# Patient Record
Sex: Female | Born: 1935 | Race: White | Hispanic: No | Smoking: Never smoker
Health system: Southern US, Community
[De-identification: ages and names within clinical notes are randomized; demographics above are authoritative.]

## PROBLEM LIST (undated history)

## (undated) DIAGNOSIS — I10 Essential (primary) hypertension: Secondary | ICD-10-CM

## (undated) DIAGNOSIS — M899 Disorder of bone, unspecified: Secondary | ICD-10-CM

## (undated) DIAGNOSIS — R569 Unspecified convulsions: Secondary | ICD-10-CM

## (undated) DIAGNOSIS — N3946 Mixed incontinence: Secondary | ICD-10-CM

## (undated) DIAGNOSIS — M159 Polyosteoarthritis, unspecified: Secondary | ICD-10-CM

## (undated) DIAGNOSIS — H900 Conductive hearing loss, bilateral: Secondary | ICD-10-CM

## (undated) DIAGNOSIS — M949 Disorder of cartilage, unspecified: Secondary | ICD-10-CM

## (undated) DIAGNOSIS — E785 Hyperlipidemia, unspecified: Secondary | ICD-10-CM

## (undated) HISTORY — DX: Mixed incontinence: N39.46

## (undated) HISTORY — DX: Polyosteoarthritis, unspecified: M15.9

## (undated) HISTORY — PX: CATARACT EXTRACTION: SUR2

## (undated) HISTORY — DX: Hyperlipidemia, unspecified: E78.5

## (undated) HISTORY — DX: Unspecified convulsions: R56.9

## (undated) HISTORY — PX: FRACTURE SURGERY: SHX138

## (undated) HISTORY — DX: Conductive hearing loss, bilateral: H90.0

## (undated) HISTORY — DX: Essential (primary) hypertension: I10

## (undated) HISTORY — DX: Disorder of bone, unspecified: M89.9

## (undated) HISTORY — DX: Disorder of cartilage, unspecified: M94.9

---
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
## 1963-05-11 DIAGNOSIS — R569 Unspecified convulsions: Secondary | ICD-10-CM

## 1963-05-11 HISTORY — DX: Unspecified convulsions: R56.9

## 1989-05-10 HISTORY — PX: ABDOMINAL HYSTERECTOMY: SHX81

## 1997-08-22 ENCOUNTER — Other Ambulatory Visit: Admission: RE | Admit: 1997-08-22 | Discharge: 1997-08-22 | Payer: Self-pay | Admitting: Family Medicine

## 1997-09-19 ENCOUNTER — Other Ambulatory Visit: Admission: RE | Admit: 1997-09-19 | Discharge: 1997-09-19 | Payer: Self-pay | Admitting: Family Medicine

## 1997-10-14 ENCOUNTER — Other Ambulatory Visit: Admission: RE | Admit: 1997-10-14 | Discharge: 1997-10-14 | Payer: Self-pay | Admitting: Family Medicine

## 1998-03-06 ENCOUNTER — Ambulatory Visit (HOSPITAL_COMMUNITY): Admission: RE | Admit: 1998-03-06 | Discharge: 1998-03-06 | Payer: Self-pay | Admitting: Family Medicine

## 1998-04-08 ENCOUNTER — Encounter (HOSPITAL_COMMUNITY): Admission: RE | Admit: 1998-04-08 | Discharge: 1998-07-07 | Payer: Self-pay | Admitting: Family Medicine

## 1999-07-30 ENCOUNTER — Encounter: Payer: Self-pay | Admitting: Emergency Medicine

## 1999-07-30 ENCOUNTER — Emergency Department (HOSPITAL_COMMUNITY): Admission: EM | Admit: 1999-07-30 | Discharge: 1999-07-30 | Payer: Self-pay | Admitting: Emergency Medicine

## 1999-09-15 ENCOUNTER — Encounter: Payer: Self-pay | Admitting: Family Medicine

## 1999-09-15 ENCOUNTER — Encounter: Admission: RE | Admit: 1999-09-15 | Discharge: 1999-09-15 | Payer: Self-pay | Admitting: Family Medicine

## 1999-11-16 ENCOUNTER — Encounter: Payer: Self-pay | Admitting: Specialist

## 1999-11-16 ENCOUNTER — Ambulatory Visit (HOSPITAL_COMMUNITY): Admission: RE | Admit: 1999-11-16 | Discharge: 1999-11-16 | Payer: Self-pay | Admitting: Specialist

## 2000-09-15 ENCOUNTER — Encounter: Payer: Self-pay | Admitting: Family Medicine

## 2000-09-15 ENCOUNTER — Encounter: Admission: RE | Admit: 2000-09-15 | Discharge: 2000-09-15 | Payer: Self-pay | Admitting: Family Medicine

## 2001-09-19 ENCOUNTER — Encounter: Admission: RE | Admit: 2001-09-19 | Discharge: 2001-09-19 | Payer: Self-pay | Admitting: Family Medicine

## 2001-09-19 ENCOUNTER — Encounter: Payer: Self-pay | Admitting: Family Medicine

## 2002-02-13 ENCOUNTER — Encounter: Admission: RE | Admit: 2002-02-13 | Discharge: 2002-02-13 | Payer: Self-pay | Admitting: Family Medicine

## 2002-02-13 ENCOUNTER — Encounter: Payer: Self-pay | Admitting: Family Medicine

## 2002-06-10 HISTORY — PX: OTHER SURGICAL HISTORY: SHX169

## 2002-06-20 ENCOUNTER — Encounter: Payer: Self-pay | Admitting: Specialist

## 2002-06-20 ENCOUNTER — Emergency Department (HOSPITAL_COMMUNITY): Admission: EM | Admit: 2002-06-20 | Discharge: 2002-06-20 | Payer: Self-pay | Admitting: Emergency Medicine

## 2002-06-20 ENCOUNTER — Encounter: Payer: Self-pay | Admitting: Emergency Medicine

## 2002-06-28 ENCOUNTER — Encounter: Payer: Self-pay | Admitting: Orthopedic Surgery

## 2002-06-29 ENCOUNTER — Inpatient Hospital Stay (HOSPITAL_COMMUNITY): Admission: RE | Admit: 2002-06-29 | Discharge: 2002-06-30 | Payer: Self-pay | Admitting: Orthopedic Surgery

## 2002-09-18 ENCOUNTER — Encounter: Payer: Self-pay | Admitting: Family Medicine

## 2002-09-18 ENCOUNTER — Encounter: Admission: RE | Admit: 2002-09-18 | Discharge: 2002-09-18 | Payer: Self-pay | Admitting: Family Medicine

## 2002-09-25 ENCOUNTER — Encounter: Payer: Self-pay | Admitting: Family Medicine

## 2002-09-25 ENCOUNTER — Encounter: Admission: RE | Admit: 2002-09-25 | Discharge: 2002-09-25 | Payer: Self-pay | Admitting: Family Medicine

## 2003-03-14 ENCOUNTER — Encounter: Admission: RE | Admit: 2003-03-14 | Discharge: 2003-03-14 | Payer: Self-pay | Admitting: Family Medicine

## 2005-06-30 ENCOUNTER — Emergency Department (HOSPITAL_COMMUNITY): Admission: EM | Admit: 2005-06-30 | Discharge: 2005-06-30 | Payer: Self-pay | Admitting: Emergency Medicine

## 2005-08-19 ENCOUNTER — Encounter: Admission: RE | Admit: 2005-08-19 | Discharge: 2005-08-19 | Payer: Self-pay | Admitting: Family Medicine

## 2005-08-26 ENCOUNTER — Encounter: Payer: Self-pay | Admitting: Internal Medicine

## 2005-08-26 ENCOUNTER — Encounter: Admission: RE | Admit: 2005-08-26 | Discharge: 2005-08-26 | Payer: Self-pay | Admitting: Family Medicine

## 2006-02-26 ENCOUNTER — Emergency Department (HOSPITAL_COMMUNITY): Admission: EM | Admit: 2006-02-26 | Discharge: 2006-02-26 | Payer: Self-pay | Admitting: Emergency Medicine

## 2006-06-04 ENCOUNTER — Emergency Department (HOSPITAL_COMMUNITY): Admission: EM | Admit: 2006-06-04 | Discharge: 2006-06-05 | Payer: Self-pay | Admitting: Emergency Medicine

## 2008-08-05 ENCOUNTER — Encounter: Payer: Self-pay | Admitting: Internal Medicine

## 2008-08-05 LAB — CONVERTED CEMR LAB
Alkaline Phosphatase: 74 units/L
Calcium: 9.6 mg/dL
Chloride: 102 meq/L
Creatinine, Ser: 0.69 mg/dL
Hemoglobin: 12.8 g/dL
Hgb A1c MFr Bld: 6 %
LDL Cholesterol: 138 mg/dL
Sodium: 141 meq/L
TSH: 2.486 microintl units/mL
Total Bilirubin: 0.3 mg/dL
Triglyceride fasting, serum: 67 mg/dL

## 2008-08-12 ENCOUNTER — Encounter: Payer: Self-pay | Admitting: Internal Medicine

## 2009-06-24 ENCOUNTER — Ambulatory Visit: Payer: Self-pay | Admitting: Internal Medicine

## 2009-06-24 DIAGNOSIS — M949 Disorder of cartilage, unspecified: Secondary | ICD-10-CM

## 2009-06-24 DIAGNOSIS — I10 Essential (primary) hypertension: Secondary | ICD-10-CM | POA: Insufficient documentation

## 2009-06-24 DIAGNOSIS — M899 Disorder of bone, unspecified: Secondary | ICD-10-CM | POA: Insufficient documentation

## 2009-06-24 DIAGNOSIS — H900 Conductive hearing loss, bilateral: Secondary | ICD-10-CM | POA: Insufficient documentation

## 2009-06-24 DIAGNOSIS — M159 Polyosteoarthritis, unspecified: Secondary | ICD-10-CM | POA: Insufficient documentation

## 2009-06-24 DIAGNOSIS — R569 Unspecified convulsions: Secondary | ICD-10-CM

## 2009-06-24 DIAGNOSIS — E785 Hyperlipidemia, unspecified: Secondary | ICD-10-CM | POA: Insufficient documentation

## 2009-07-28 ENCOUNTER — Telehealth: Payer: Self-pay | Admitting: Internal Medicine

## 2009-08-12 ENCOUNTER — Ambulatory Visit: Payer: Self-pay | Admitting: Internal Medicine

## 2009-09-09 ENCOUNTER — Telehealth: Payer: Self-pay | Admitting: Internal Medicine

## 2009-10-13 ENCOUNTER — Telehealth: Payer: Self-pay | Admitting: Internal Medicine

## 2010-01-19 ENCOUNTER — Telehealth: Payer: Self-pay | Admitting: Internal Medicine

## 2010-02-10 ENCOUNTER — Ambulatory Visit: Payer: Self-pay | Admitting: Internal Medicine

## 2010-02-10 DIAGNOSIS — N3946 Mixed incontinence: Secondary | ICD-10-CM | POA: Insufficient documentation

## 2010-04-27 ENCOUNTER — Telehealth: Payer: Self-pay | Admitting: Internal Medicine

## 2010-05-19 ENCOUNTER — Ambulatory Visit
Admission: RE | Admit: 2010-05-19 | Discharge: 2010-05-19 | Payer: Self-pay | Source: Home / Self Care | Attending: Internal Medicine | Admitting: Internal Medicine

## 2010-06-07 LAB — CONVERTED CEMR LAB
ALT: 15 units/L (ref 0–35)
Albumin: 3.9 g/dL (ref 3.5–5.2)
Basophils Relative: 0.4 % (ref 0.0–3.0)
Bilirubin, Direct: 0.1 mg/dL (ref 0.0–0.3)
Direct LDL: 139.9 mg/dL
Eosinophils Absolute: 0.1 10*3/uL (ref 0.0–0.7)
Glucose, Bld: 95 mg/dL (ref 70–99)
Hemoglobin: 12.8 g/dL (ref 12.0–15.0)
MCV: 96.6 fL (ref 78.0–100.0)
Monocytes Absolute: 0.5 10*3/uL (ref 0.1–1.0)
Monocytes Relative: 8.2 % (ref 3.0–12.0)
Neutro Abs: 4.1 10*3/uL (ref 1.4–7.7)
Platelets: 230 10*3/uL (ref 150.0–400.0)
RBC: 3.79 M/uL — ABNORMAL LOW (ref 3.87–5.11)
RDW: 13.1 % (ref 11.5–14.6)
Sodium: 139 meq/L (ref 135–145)
Total Bilirubin: 0.4 mg/dL (ref 0.3–1.2)
Total CHOL/HDL Ratio: 2
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
## 2010-06-11 NOTE — Progress Notes (Signed)
Summary: dilantin  Phone Note Refill Request Message from:  Fax from Pharmacy on October 13, 2009 3:19 PM  Refills Requested: Medication #1:  DILANTIN 100 MG CAPS 3 caps Sat & Sun   Last Refilled: 09/09/2009  Method Requested: Electronic Initial call taken by: Orlan Leavens,  October 13, 2009 3:19 PM    Prescriptions: DILANTIN 100 MG CAPS (PHENYTOIN SODIUM EXTENDED) 3 caps Sat & Sun, 4 caps Mon-Fri  #120 x 5   Entered by:   Orlan Leavens   Authorized by:   Newt Lukes MD   Signed by:   Orlan Leavens on 10/13/2009   Method used:   Electronically to        [facility]* (retail)       [address]       [place], [place]  [id number]       Ph: [phone]       Fax: [phone]   RxID:   [id number]
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
## 2010-06-11 NOTE — Progress Notes (Signed)
Summary: Dilantin  Phone Note Refill Request Message from:  Fax from Pharmacy on April 27, 2010 11:27 AM  Refills Requested: Medication #1:  DILANTIN 100 MG CAPS 3 caps Sat & Wynelle Link   Last Refilled: 03/27/2010 [facility]   Method Requested: Electronic Initial call taken by: Orlan Leavens RMA,  April 27, 2010 11:27 AM    Prescriptions: DILANTIN 100 MG CAPS (PHENYTOIN SODIUM EXTENDED) 3 caps Sat & Sun, 4 caps Mon-Fri  #120 x 2   Entered by:   Orlan Leavens RMA   Authorized by:   Newt Lukes MD   Signed by:   Orlan Leavens RMA on 04/27/2010   Method used:   Electronically to        [facility]* (retail)       [address]       [place], [place]  [id number]       Ph: [phone]       Fax: [phone]   RxID:   [id number]

## 2010-06-11 NOTE — Progress Notes (Signed)
Summary: hctz  Phone Note Refill Request Message from:  Fax from Pharmacy on July 28, 2009 3:22 PM  Refills Requested: Medication #1:  HYDROCHLOROTHIAZIDE 12.5 MG CAPS 1 by mouth once daily   Last Refilled: 06/30/2009  Method Requested: Electronic Initial call taken by: Orlan Leavens,  July 28, 2009 3:22 PM    Prescriptions: HYDROCHLOROTHIAZIDE 12.5 MG CAPS (HYDROCHLOROTHIAZIDE) 1 by mouth once daily  #30 x 5   Entered by:   Orlan Leavens   Authorized by:   Newt Lukes MD   Signed by:   Orlan Leavens on 07/28/2009   Method used:   Electronically to        [facility]* (retail)       [address]       [place], [place]  [id number]       Ph: [phone]       Fax: [phone]   RxID:   [id number]

## 2010-06-11 NOTE — Progress Notes (Signed)
Summary: hctz  Phone Note Refill Request Message from:  Fax from Pharmacy on January 19, 2010 4:24 PM  Refills Requested: Medication #1:  HYDROCHLOROTHIAZIDE 12.5 MG CAPS 1 by mouth once daily  Method Requested: Electronic Initial call taken by: Orlan Leavens RMA,  January 19, 2010 4:24 PM    Prescriptions: HYDROCHLOROTHIAZIDE 12.5 MG CAPS (HYDROCHLOROTHIAZIDE) 1 by mouth once daily  #30 x 6   Entered by:   Orlan Leavens RMA   Authorized by:   Newt Lukes MD   Signed by:   Orlan Leavens RMA on 01/19/2010   Method used:   Electronically to        [facility]* (retail)       [address]       [place], [place]  [id number]       Ph: [phone]       Fax: [phone]   RxID:   [id number]
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
## 2010-06-11 NOTE — Progress Notes (Signed)
Summary: coreg  Phone Note Refill Request Message from:  Fax from Pharmacy on Sep 09, 2009 8:47 AM  Refills Requested: Medication #1:  COREG CR 40 MG XR24H-CAP 1 q am with food  Method Requested: Electronic Initial call taken by: Orlan Leavens,  Sep 09, 2009 8:47 AM    Prescriptions: COREG CR 40 MG XR24H-CAP (CARVEDILOL PHOSPHATE) 1 q am with food  #30 x 11   Entered by:   Orlan Leavens   Authorized by:   Newt Lukes MD   Signed by:   Orlan Leavens on 09/09/2009   Method used:   Electronically to        [facility]* (retail)       [address]       [place], [place]  [id number]       Ph: [phone]       Fax: [phone]   RxID:   [id number]

## 2010-06-11 NOTE — Letter (Signed)
Summary: Notes and test/James Kindl MD  Bradd Canary MD   Imported By: Lester Batavia 06/28/2009 09:43:43  _____________________________________________________________________  External Attachment:    Type:   Image     Comment:   External Document
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
## 2010-07-27 ENCOUNTER — Telehealth: Payer: Self-pay | Admitting: Internal Medicine

## 2010-07-29 ENCOUNTER — Encounter: Payer: Self-pay | Admitting: *Deleted

## 2010-08-06 NOTE — Progress Notes (Signed)
Summary: dilantin  Phone Note Refill Request Message from:  Fax from Pharmacy on July 27, 2010 11:27 AM  Refills Requested: Medication #1:  DILANTIN 100 MG CAPS 3 caps Sat & Sun, and 4 capsule daily Mon thru Fri [facility]   Method Requested: Electronic Initial call taken by: Orlan Leavens RMA,  July 27, 2010 11:28 AM    Prescriptions: DILANTIN 100 MG CAPS (PHENYTOIN SODIUM EXTENDED) 3 caps Sat & Sun, 4 caps Mon-Fri  #112 x 2   Entered by:   Orlan Leavens RMA   Authorized by:   Newt Lukes MD   Signed by:   Orlan Leavens RMA on 07/27/2010   Method used:   Electronically to        [facility]* (retail)       [address]       [place], [place]  [id number]       Ph: [phone]       Fax: [phone]   RxID:   [id number]

## 2010-08-18 ENCOUNTER — Other Ambulatory Visit (INDEPENDENT_AMBULATORY_CARE_PROVIDER_SITE_OTHER): Payer: Medicare Other

## 2010-08-18 ENCOUNTER — Ambulatory Visit: Payer: Self-pay | Admitting: Internal Medicine

## 2010-08-18 ENCOUNTER — Encounter: Payer: Self-pay | Admitting: Internal Medicine

## 2010-08-18 ENCOUNTER — Ambulatory Visit (INDEPENDENT_AMBULATORY_CARE_PROVIDER_SITE_OTHER): Payer: Medicare Other | Admitting: Internal Medicine

## 2010-08-18 DIAGNOSIS — Z79899 Other long term (current) drug therapy: Secondary | ICD-10-CM

## 2010-08-18 DIAGNOSIS — N3946 Mixed incontinence: Secondary | ICD-10-CM

## 2010-08-18 DIAGNOSIS — R569 Unspecified convulsions: Secondary | ICD-10-CM

## 2010-08-18 DIAGNOSIS — I10 Essential (primary) hypertension: Secondary | ICD-10-CM

## 2010-08-18 LAB — CBC WITH DIFFERENTIAL/PLATELET
Basophils Relative: 0.4 % (ref 0.0–3.0)
Eosinophils Absolute: 0.1 10*3/uL (ref 0.0–0.7)
Eosinophils Relative: 1.7 % (ref 0.0–5.0)
Hemoglobin: 12.3 g/dL (ref 12.0–15.0)
Lymphs Abs: 1.3 10*3/uL (ref 0.7–4.0)
MCV: 98 fl (ref 78.0–100.0)
Monocytes Absolute: 0.4 10*3/uL (ref 0.1–1.0)
Monocytes Relative: 7.9 % (ref 3.0–12.0)
Platelets: 211 10*3/uL (ref 150.0–400.0)

## 2010-08-18 LAB — HEPATIC FUNCTION PANEL
ALT: 13 U/L (ref 0–35)
Bilirubin, Direct: 0.1 mg/dL (ref 0.0–0.3)
Total Bilirubin: 0.3 mg/dL (ref 0.3–1.2)

## 2010-08-18 NOTE — Assessment & Plan Note (Signed)
Off HCTZ since 05/2010 - The current medical regimen is effective for age;  continue present plan and medications. BP Readings from Last 3 Encounters:  08/18/10 136/82  05/19/10 142/80  02/10/10 130/70

## 2010-08-18 NOTE — Patient Instructions (Signed)
It was good to see you today. Medications reviewed, no changes at this time. Test(s) ordered today. Your results will be called to you after review (48-72hours after test completion). If any changes need to be made, you will be notified at that time. Please schedule followup in 3-4 months to monitor blood pressure, call sooner if problems.
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
## 2010-08-18 NOTE — Assessment & Plan Note (Signed)
Improved off HCTZ 05/2010 - declines other med tx for same -  continue present plan and medications.
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
## 2010-08-19 ENCOUNTER — Telehealth: Payer: Self-pay | Admitting: Internal Medicine

## 2010-08-19 NOTE — Telephone Encounter (Signed)
Pt Notified with lab results..08/19/10@3 :58pm/LMB

## 2010-08-19 NOTE — Telephone Encounter (Signed)
Please call patient - normal results. No medication changes recommended. Thanks.    

## 2010-09-21 ENCOUNTER — Other Ambulatory Visit: Payer: Self-pay | Admitting: *Deleted

## 2010-09-21 MED ORDER — CARVEDILOL PHOSPHATE ER 40 MG PO CP24: 40.0000 mg | ORAL_CAPSULE | ORAL | Status: DC

## 2010-09-25 NOTE — Op Note (Signed)
NAME:  April Conway, April Conway                        ACCOUNT NO.:  [id number]   MEDICAL RECORD NO.:  [id number]                   PATIENT TYPE:  INP   LOCATION:  5008                                 FACILITY:  [facility]   PHYSICIAN:  Dionne Ano. Everlene Other, M.D.         DATE OF BIRTH:  [birth year]   DATE OF PROCEDURE:  06/29/2002  DATE OF DISCHARGE:                                 OPERATIVE REPORT   PREOPERATIVE DIAGNOSIS:  Comminuted, complex, displaced left distal radius  fracture.   POSTOPERATIVE DIAGNOSIS:  Comminuted, complex, displaced left distal radius  fracture.   PROCEDURES:  1. Open reduction and internal fixation, left comminuted complex distal     radius fracture with calcium sulfate bone grafting using MIG from [facility].  2. Extensor pollicis longus tendon sheath decompression (incision in third     dorsal compartment).  3. Posterior interosseous nerve neurectomy, left wrist.  4. Stress radiography.   SURGEON:  Dionne Ano. Amanda Pea, M.D.   ASSISTANT:  Karie Chimera, P.A.-C.   COMPLICATIONS:  None.   ANESTHESIA:  Infraclavicular block with IV sedation as necessary.   TOURNIQUET TIME:  Less than an hour.   DRAINS:  One.   INDICATION FOR PROCEDURE:  This patient is a very pleasant 77 year old  female who presents with the above-mentioned diagnosis.  I have counseled  her in regard to the risks and benefits of surgery, including the risks of  infection, bleeding, anesthesia, damage to normal structures, and failure of  the surgery to accomplish its intended goals of relieving symptoms and  restoring function.  With this in mind, she desires to proceed.  All  questions have been encouraged and answered preoperatively.   OPERATIVE FINDINGS:  The patient had a displaced, comminuted, complex radius  fracture.  She underwent ORIF with MIG bone grafting from [facility] and  underwent a posterior interosseous nerve neurectomy and EPL tendon sheath  incision  without difficulty.  The patient tolerated this well, and there  were no complications.   DESCRIPTION OF PROCEDURE:  The patient was seen by myself and anesthesia.  She was taken to the operative suite after infraclavicular block was placed  by Quita Skye. Krista Blue, M.D.  This was an excellent block and once this was  done, she was given IV antibiotics.  The __________ and prepped and draped  in the usual sterile fashion and had IV sedation as necessary.  After  Betadine scrub and paint were applied, the patient then underwent incision  of the volar aspect of the wrist.  This was volar radial.  This an FCR  sheath-splitting incision.  The FCR and carpal canal contents were retracted  ulnarly.  The pronator quadratus was then found and retracted in a radial to  ulnar fashion, exposing the fracture site, which was then reduced.  I placed  fingertrap traction and allowed for excellent reduction, held this with  provisional fixation using a  0.062 K-wire off the radial styloid.  I used  stress fluoroscopy to verify correct reduction and then applied a [employer] plate without difficulty.  Once the plate was applied I checked  the screws multiple times to ensure that the screws were in place and did  not impinge into the articular surface.  This was done to my satisfaction  without difficulty.  Once this was complete and the plate was secured, I  then made an incision dorsally and I performed a posterior interosseous  nerve neurectomy and an EPL tendon sheath incision.  Blood egressed from the  EPL tendon sheath.  I then transposed this anteriorly and following this  placed bone graft in the dorsal V defect with calcium sulfate mixture.  The  patient tolerated this well.  This was done under fluoroscopy.  Following  this stress radiography was performed, indicating correct placement of the  bone graft and excellent stability.  She will be a candidate for early range  of motion.   Following  this the tourniquet was deflated, hemostasis was obtained with  bipolar electrocautery.  The pronator quadratus was repaired with 3-0 Vicryl  over the plate.  The patient had a TLS drain placed and following this, the  wound was closed with Vicryl in the subcu, followed by Prolene in the skin  edge.  The patient tolerated this well, had a sterile dressing applied and a  volar plaster splint placed.  Following this she was taken to recovery.  She  will be monitored.  She will be placed on IV antibiotics, appropriate pain  management, and elevation precautions.  I have discussed with her do's and  don'ts, etc.  OT will see her for edema control, range of motion, and other  measures, and she will, of course, have postop IV antibiotics.   I have discussed with the family all findings.  We look forward to  participating in her postoperative care.                                               Dionne Ano. Everlene Other, M.D.    Nash Mantis  D:  06/29/2002  T:  06/29/2002  Job:  [id number]

## 2010-10-19 ENCOUNTER — Other Ambulatory Visit: Payer: Self-pay | Admitting: *Deleted

## 2010-10-19 MED ORDER — PHENYTOIN SODIUM EXTENDED 100 MG PO CAPS: ORAL_CAPSULE | ORAL | Status: DC

## 2010-11-05 ENCOUNTER — Encounter: Payer: Self-pay | Admitting: Internal Medicine

## 2010-12-01 ENCOUNTER — Encounter: Payer: Self-pay | Admitting: Podiatry

## 2010-12-22 ENCOUNTER — Ambulatory Visit (INDEPENDENT_AMBULATORY_CARE_PROVIDER_SITE_OTHER): Payer: Medicare Other | Admitting: Internal Medicine

## 2010-12-22 ENCOUNTER — Encounter: Payer: Self-pay | Admitting: Internal Medicine

## 2010-12-22 VITALS — BP 132/70 | HR 65 | Temp 98.8°F | Ht 66.0 in | Wt 159.1 lb

## 2010-12-22 DIAGNOSIS — L309 Dermatitis, unspecified: Secondary | ICD-10-CM

## 2010-12-22 DIAGNOSIS — E785 Hyperlipidemia, unspecified: Secondary | ICD-10-CM

## 2010-12-22 DIAGNOSIS — R569 Unspecified convulsions: Secondary | ICD-10-CM

## 2010-12-22 DIAGNOSIS — I10 Essential (primary) hypertension: Secondary | ICD-10-CM

## 2010-12-22 DIAGNOSIS — L259 Unspecified contact dermatitis, unspecified cause: Secondary | ICD-10-CM

## 2010-12-22 MED ORDER — TRIAMCINOLONE ACETONIDE 0.1 % EX LOTN: TOPICAL_LOTION | Freq: Two times a day (BID) | CUTANEOUS | Status: DC | PRN
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
## 2010-12-22 NOTE — Assessment & Plan Note (Signed)
Last lipids >34mo but very good HDL >90 Continue diet and exercise, never on meds for same
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
## 2010-12-22 NOTE — Patient Instructions (Addendum)
It was good to see you today. Medications reviewed, no changes at this time. Use prescription lotion for hands as needed and stop your newest lotions Please schedule followup in 4 months to monitor blood pressure and cholesterol check, call sooner if problems.

## 2010-12-22 NOTE — Assessment & Plan Note (Signed)
Off HCTZ since 05/2010 - The current medical regimen is effective for age;  continue present plan and medications. BP Readings from Last 3 Encounters:  12/22/10 132/70  08/18/10 136/82  05/19/10 142/80

## 2011-01-12 ENCOUNTER — Ambulatory Visit (INDEPENDENT_AMBULATORY_CARE_PROVIDER_SITE_OTHER): Payer: Medicare Other | Admitting: Internal Medicine

## 2011-01-12 ENCOUNTER — Other Ambulatory Visit (INDEPENDENT_AMBULATORY_CARE_PROVIDER_SITE_OTHER): Payer: Medicare Other

## 2011-01-12 ENCOUNTER — Encounter: Payer: Self-pay | Admitting: Internal Medicine

## 2011-01-12 DIAGNOSIS — L309 Dermatitis, unspecified: Secondary | ICD-10-CM

## 2011-01-12 DIAGNOSIS — R569 Unspecified convulsions: Secondary | ICD-10-CM

## 2011-01-12 DIAGNOSIS — L259 Unspecified contact dermatitis, unspecified cause: Secondary | ICD-10-CM

## 2011-01-12 DIAGNOSIS — Z79899 Other long term (current) drug therapy: Secondary | ICD-10-CM

## 2011-01-12 LAB — BASIC METABOLIC PANEL
Calcium: 8.9 mg/dL (ref 8.4–10.5)
Chloride: 104 mEq/L (ref 96–112)
Creatinine, Ser: 0.6 mg/dL (ref 0.4–1.2)
GFR: 106.79 mL/min (ref >60.00)
Glucose, Bld: 99 mg/dL (ref 70–99)

## 2011-01-12 LAB — HEPATIC FUNCTION PANEL
AST: 15 U/L (ref 0–37)
Albumin: 3.7 g/dL (ref 3.5–5.2)
Bilirubin, Direct: 0.1 mg/dL (ref 0.0–0.3)

## 2011-01-12 LAB — TSH: TSH: 1.54 u[IU]/mL (ref 0.35–5.50)

## 2011-01-12 LAB — CBC WITH DIFFERENTIAL/PLATELET
Basophils Absolute: 0 10*3/uL (ref 0.0–0.1)
Lymphocytes Relative: 17.3 % (ref 12.0–46.0)
MCHC: 33.8 g/dL (ref 30.0–36.0)
Monocytes Absolute: 0.5 10*3/uL (ref 0.1–1.0)
Monocytes Relative: 7.4 % (ref 3.0–12.0)
Neutrophils Relative %: 73.2 % (ref 43.0–77.0)
RBC: 3.62 Mil/uL — ABNORMAL LOW (ref 3.87–5.11)
RDW: 13.8 % (ref 11.5–14.6)
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
## 2011-01-12 NOTE — Progress Notes (Signed)
  Subjective:    Patient ID: April Conway, female    DOB: 17-Jul-1922, 77 y.o.   MRN: [id number]  HPI  patient is here for followup rash -  seen 8/14 for ROV - incidentally noted "rash" on hands - tx kenalog lotion for presumed eczema - not improved Concerned about rxn to dilantin so stopped same 8/29 thru slow taper - feels overall better now Rash affects only palms of hands associated with itching and peeling - no swelling or redness - no other body involved  Past Medical History  Diagnosis Date  . CONDUCTIVE HEARING LOSS BILATERAL   . OSTEOPENIA   . URINARY INCONTINENCE, MIXED 02/10/2010  . DYSLIPIDEMIA   . HYPERTENSION   . SEIZURE DISORDER 1965    last bkthru 1999  . GEN OSTEOARTHROSIS INVOLVING MULTIPLE SITES    Review of Systems  Constitutional: Negative for fever and unexpected weight change.  Cardiovascular: Negative for chest pain.  Neurological: Negative for seizures, syncope and headaches.       Objective:   Physical Exam  BP 148/72  Pulse 78  Temp(Src) 98.5 F (36.9 C) (Oral)  Ht 5\' 7"  (1.702 m)  Wt 161 lb 12.8 oz (73.392 kg)  BMI 25.34 kg/m2  SpO2 95% BP Readings from Last 3 Encounters:  01/12/11 148/72  12/22/10 132/70  08/18/10 136/82   Constitutional: She appears well-developed and well-nourished. No distress.  HENT: Head: Normocephalic. Mouth/Throat: No oropharyngeal exudate or mucosal peeling.       No hearing on left side (chronic), B hearing aides present  Neck: Normal range of motion. Neck supple. No thyromegaly present.  Cardiovascular: Normal rate, regular rhythm and normal heart sounds.  Exam reveals no friction rub.  No murmur heard. no BLE edema Pulmonary/Chest: Effort normal and breath sounds normal. No respiratory distress. She has no wheezes.  Psychiatric: She has a normal mood and affect. Her behavior is normal.   Very pleasant and bright  Neurology - CN2-12 symmetrically intact; MAE with good strength; cognition, balance and memory  normal Skin: dry peeling and eczema of B palms of hands>fingers - no erythema or ulcers - no other body skin or mucosa invovement- soles of feet normal   Lab Results  Component Value Date   WBC 5.6 08/18/2010   HGB 12.3 08/18/2010   HCT 35.5* 08/18/2010   PLT 211.0 08/18/2010   CHOL 241* 08/12/2009   TRIG 70.0 08/12/2009   HDL 96.60 08/12/2009   LDLDIRECT 139.9 08/12/2009   ALT 13 08/18/2010   AST 15 08/18/2010   NA 139 08/12/2009   K 4.2 08/12/2009   CL 100 08/12/2009   CREATININE 0.6 08/18/2010   BUN 11 08/12/2009   CO2 32 08/12/2009   TSH 2.25 08/12/2009   HGBA1C 6.0 08/05/2008       Assessment & Plan:   Eczema, hands - kenalog lotion as needed 8/14 not improving symptoms - now stopped dilantin - ?relation but ok to do same - see next - check labs /ro eosinophilic rxn or any other drug toxicity

## 2011-01-12 NOTE — Patient Instructions (Addendum)
It was good to see you today. Ok to stop dilantin as discussed - will watch for any recurrent seizure events and consider other medication in future if needed Test(s) ordered today. Your results will be called to you after review (48-72hours after test completion). If any changes need to be made, you will be notified at that time. Hand Dermatitis Hand dermatitis (also called dyshidrosis), is a skin condition. Small, itchy, raised dots or fluid-filled blisters form over the palms of the hands. The feet may sometimes also be involved.   CAUSES The cause is unknown.  But this problem occurs most often in patients with a history of allergies such as:   Hay fever and allergies.   Asthma.   Hives.   Chemical exposure, injuries, or anything else that irritates the skin makes hand dermatitis worse.   Frequent hand washing:   Removes natural oils.   Dries the skin.   Can contribute to outbreaks.   Exposure to household chemicals or hair care products can also damage the skin.   Allergy to latex gloves can cause:   Hand dermatitis.   Hives.   Breathing problems.   Outbreaks of hand dermatitis can last 3-4 weeks.  SYMPTOMS The most common symptom is intense itching. Cracks or grooves (fissures) on the fingers or toes can develop. Affected areas can be painful, especially with large blisters. TREATMENT Treatment for hand dermatitis includes avoiding excessive hand washing and taking measures to avoid secondary infection. Steroid creams and ointments (such as over-the-counter 1% hydrocortisone cream) can reduce inflammation and improve moisture. These should be applied at least 2-4 times/day. Your caregiver may ask you to use a stronger prescription steroid cream to help speed the disappearance and improve the appearance of the blisters, and to treat the cracks and fissures that occur after the blisters have dried.   Moisturizers work well such as:  Eucerin cream.   Cetaphil cream.     Aquaphor.   Vaseline.   If the dermatitis is severe, oral cortisone medicine or even antibiotics may be needed at the start of treatment.  Compresses. Wet or cold compresses can help:  Alleviate itching.   Increase the effectiveness of the topical creams.   Minimize blisters.  Antihistamines. Your caregiver may prescribe anti-itching medication such as diphenhydramine (Benadryl) or loratadine (Claritin), to help reduce itching.   Avoid all harsh chemicals. Wear protective gloves when necessary. Use gloves when you shampoo or use hair creams or dyes.   SEEK MEDICAL CARE IF:  The rash is not better after one week of treatment.   Signs of infection develop.   The rash is spreading despite treatment.  Document Released: 04/26/2005 Document Re-Released: 02/21/2007 [facility] Patient Information 2011 [place], [place].

## 2011-02-16 ENCOUNTER — Encounter: Payer: Self-pay | Admitting: Internal Medicine

## 2011-02-16 ENCOUNTER — Ambulatory Visit (INDEPENDENT_AMBULATORY_CARE_PROVIDER_SITE_OTHER): Payer: Medicare Other | Admitting: Internal Medicine

## 2011-02-16 VITALS — BP 142/68 | HR 88 | Temp 97.0°F | Ht 65.0 in | Wt 155.2 lb

## 2011-02-16 DIAGNOSIS — I10 Essential (primary) hypertension: Secondary | ICD-10-CM

## 2011-02-16 DIAGNOSIS — Z23 Encounter for immunization: Secondary | ICD-10-CM

## 2011-02-16 DIAGNOSIS — R569 Unspecified convulsions: Secondary | ICD-10-CM

## 2011-02-16 MED ORDER — AMLODIPINE BESYLATE 5 MG PO TABS: 5.0000 mg | ORAL_TABLET | Freq: Every day | ORAL | Status: DC

## 2011-02-16 MED ORDER — LEVETIRACETAM 500 MG PO TABS: 500.0000 mg | ORAL_TABLET | Freq: Two times a day (BID) | ORAL | Status: DC

## 2011-02-16 NOTE — Progress Notes (Signed)
  Subjective:    Patient ID: April Conway, female    DOB: 03-14-1923, 77 y.o.   MRN: [id number]  HPI  patient is here for follow up -  Seizure - off AED/dilantin since 12/2010 due to rash - no recurrent events but dtr concerned with possible "small unresponsive events" last 48h as in1999 when stopped dilantin previously >> pt willing to resume alt med  HTN - also stopped coreg due to ?SE - no edema - no headache or chest pain    Past Medical History  Diagnosis Date  . CONDUCTIVE HEARING LOSS BILATERAL   . OSTEOPENIA   . URINARY INCONTINENCE, MIXED   . DYSLIPIDEMIA   . HYPERTENSION   . SEIZURE DISORDER 1965    last bkthru 1999  . GEN OSTEOARTHROSIS INVOLVING MULTIPLE SITES    Review of Systems  Constitutional: Negative for fever and unexpected weight change.  Cardiovascular: Negative for chest pain.  Neurological: Negative for seizures, syncope and headaches.       Objective:   Physical Exam  BP 142/68  Pulse 88  Temp(Src) 97 F (36.1 C) (Oral)  Ht 5\' 5"  (1.651 m)  Wt 155 lb 3.2 oz (70.398 kg)  BMI 25.83 kg/m2  SpO2 98% BP Readings from Last 3 Encounters:  02/16/11 142/68  01/12/11 148/72  12/22/10 132/70   Constitutional: She appears well-developed and well-nourished. No distress. Dtr at side HENT: Head: Normocephalic. Mouth/Throat: No oropharyngeal exudate or redness.       No hearing on left side (chronic), B hearing aides present  Neck: Normal range of motion. Neck supple. No thyromegaly present.  Cardiovascular: Normal rate, regular rhythm and normal heart sounds.  Exam reveals no friction rub.  No murmur heard. no BLE edema Pulmonary/Chest: Effort normal and breath sounds normal. No respiratory distress. She has no wheezes.  Psychiatric: She has a normal mood and affect. Her behavior is normal.   Very pleasant and bright  Neurology - CN2-12 symmetrically intact; MAE with good strength; cognition, balance and memory normal  Lab Results  Component Value  Date   WBC 6.8 01/12/2011   HGB 12.0 01/12/2011   HCT 35.4* 01/12/2011   PLT 207.0 01/12/2011   CHOL 241* 08/12/2009   TRIG 70.0 08/12/2009   HDL 96.60 08/12/2009   LDLDIRECT 139.9 08/12/2009   ALT 11 01/12/2011   AST 15 01/12/2011   NA 139 01/12/2011   K 4.9 01/12/2011   CL 104 01/12/2011   CREATININE 0.6 01/12/2011   BUN 17 01/12/2011   CO2 30 01/12/2011   TSH 1.54 01/12/2011   HGBA1C 6.0 08/05/2008       Assessment & Plan:   See problem list. Medications and labs reviewed today.

## 2011-02-16 NOTE — Patient Instructions (Signed)
It was good to see you today. Start Keppra 2x/day for seizure prevention and Amlodipine 1x/day for blood pressure  Your prescription(s) have been submitted to your pharmacy. Please take as directed and contact our office if you believe you are having problem(s) with the medication(s). Please schedule followup in 3 months for blood pressure and medication check, call sooner if problems.

## 2011-02-16 NOTE — Assessment & Plan Note (Signed)
Off HCTZ since 05/2010 - self stopped Coreg 01/2011 Will restart tx - choose amlodipine qd - new erx done  BP Readings from Last 3 Encounters:  02/16/11 142/68  01/12/11 148/72  12/22/10 132/70
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
## 2011-04-27 ENCOUNTER — Ambulatory Visit (INDEPENDENT_AMBULATORY_CARE_PROVIDER_SITE_OTHER): Payer: Medicare Other | Admitting: Internal Medicine

## 2011-04-27 ENCOUNTER — Encounter: Payer: Self-pay | Admitting: Internal Medicine

## 2011-04-27 VITALS — BP 122/82 | HR 82 | Temp 98.2°F | Wt 157.8 lb

## 2011-04-27 DIAGNOSIS — M5430 Sciatica, unspecified side: Secondary | ICD-10-CM

## 2011-04-27 DIAGNOSIS — R569 Unspecified convulsions: Secondary | ICD-10-CM

## 2011-04-27 DIAGNOSIS — I10 Essential (primary) hypertension: Secondary | ICD-10-CM

## 2011-04-27 MED ORDER — MELOXICAM 7.5 MG PO TABS: 7.5000 mg | ORAL_TABLET | Freq: Every day | ORAL | Status: DC

## 2011-04-27 NOTE — Progress Notes (Signed)
  Subjective:    Patient ID: April Conway, female    DOB: 1922/07/19, 77 y.o.   MRN: [id number]  HPI  patient is here for follow up -  Seizure - stopped dilantin 12/2010 due to rash - recurrent events per dtr: "small unresponsive events" in 02/2011 same as in 1999 when previously stopped dilantin >> pt willing to resume alt med>started Keppra and doing well, no other breakthru events  HTN - also stopped coreg 12/2010 due to ?SE - no edema - no headache, edema or or chest pain  Started amlodipine 02/2011 and doing well without adverse side effects   complains of R lateral thigh pain from buttock to knee - no falls or injury - denies specific back pain or joint swelling  Past Medical History  Diagnosis Date  . CONDUCTIVE HEARING LOSS BILATERAL   . OSTEOPENIA   . URINARY INCONTINENCE, MIXED   . DYSLIPIDEMIA   . HYPERTENSION   . SEIZURE DISORDER 1965    last bkthru 1999  . GEN OSTEOARTHROSIS INVOLVING MULTIPLE SITES    Review of Systems  Constitutional: Negative for fever and unexpected weight change.  Cardiovascular: Negative for chest pain.  Neurological: Negative for seizures, syncope and headaches.       Objective:   Physical Exam  BP 122/82  Pulse 82  Temp(Src) 98.2 F (36.8 C) (Oral)  Wt 157 lb 12.8 oz (71.578 kg)  SpO2 95% BP Readings from Last 3 Encounters:  04/27/11 122/82  02/16/11 142/68  01/12/11 148/72   Constitutional: She appears well-developed and well-nourished. No distress.  HENT: Head: Normocephalic. Mouth/Throat: No oropharyngeal exudate or redness.       No hearing on left side (chronic), B hearing aides present  Neck: Normal range of motion. Neck supple. No thyromegaly present.  Cardiovascular: Normal rate, regular rhythm and normal heart sounds. no rub or murmur heard. no BLE edema Pulmonary/Chest: Effort normal and breath sounds normal. No respiratory distress or wheeze.  Mskel: chronically enlarged R knee with boggy synovitis but FROM, RLE  sciatica pain distribution - hip with FROM, nonpainful to palpation over R groin or vertebra Psychiatric: She has a normal mood and affect. Her behavior is normal.   Very pleasant and bright  Neurology - CN2-12 symmetrically intact; MAE with good strength; cognition, balance and memory normal  Lab Results  Component Value Date   WBC 6.8 01/12/2011   HGB 12.0 01/12/2011   HCT 35.4* 01/12/2011   PLT 207.0 01/12/2011   CHOL 241* 08/12/2009   TRIG 70.0 08/12/2009   HDL 96.60 08/12/2009   LDLDIRECT 139.9 08/12/2009   ALT 11 01/12/2011   AST 15 01/12/2011   NA 139 01/12/2011   K 4.9 01/12/2011   CL 104 01/12/2011   CREATININE 0.6 01/12/2011   BUN 17 01/12/2011   CO2 30 01/12/2011   TSH 1.54 01/12/2011   HGBA1C 6.0 08/05/2008       Assessment & Plan:   R sciatica/DDD - declines back xray - treat with NSAIDs x 1 week,. Then prn - meloxicam erx done  Also See problem list. Medications and labs reviewed today.

## 2011-04-27 NOTE — Patient Instructions (Signed)
It was good to see you today. Continue Keppra and Norvasc as ongoing Start meloxicam for arthritis and leg pain - take daily x 1 week, then as needed for [pain symptoms  Your prescription(s) have been submitted to your pharmacy. Please take as directed and contact our office if you believe you are having problem(s) with the medication(s).  Please schedule followup in 4 months, call sooner if problems.

## 2011-04-27 NOTE — Assessment & Plan Note (Signed)
Off HCTZ since 05/2010 - self stopped Coreg 01/2011 Resumed tx with amlodipine 02/2011 - doing well The current medical regimen is effective;  continue present plan and medications.   BP Readings from Last 3 Encounters:  04/27/11 122/82  02/16/11 142/68  01/12/11 148/72
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
## 2011-06-15 ENCOUNTER — Encounter: Payer: Self-pay | Admitting: Internal Medicine

## 2011-06-15 ENCOUNTER — Other Ambulatory Visit (INDEPENDENT_AMBULATORY_CARE_PROVIDER_SITE_OTHER): Payer: Medicare Other

## 2011-06-15 ENCOUNTER — Inpatient Hospital Stay (INDEPENDENT_AMBULATORY_CARE_PROVIDER_SITE_OTHER)
Admission: RE | Admit: 2011-06-15 | Discharge: 2011-06-15 | Disposition: A | Discharge status: Home / Self Care | Payer: Medicare Other | Source: Ambulatory Visit

## 2011-06-15 ENCOUNTER — Ambulatory Visit (INDEPENDENT_AMBULATORY_CARE_PROVIDER_SITE_OTHER): Payer: Medicare Other | Admitting: Internal Medicine

## 2011-06-15 VITALS — BP 134/72 | HR 108 | Temp 98.2°F | Ht 67.0 in | Wt 157.0 lb

## 2011-06-15 DIAGNOSIS — E785 Hyperlipidemia, unspecified: Secondary | ICD-10-CM

## 2011-06-15 DIAGNOSIS — M899 Disorder of bone, unspecified: Secondary | ICD-10-CM

## 2011-06-15 DIAGNOSIS — Z0000 Encounter for general adult medical examination without abnormal findings: Secondary | ICD-10-CM

## 2011-06-15 DIAGNOSIS — M949 Disorder of cartilage, unspecified: Secondary | ICD-10-CM

## 2011-06-15 DIAGNOSIS — I10 Essential (primary) hypertension: Secondary | ICD-10-CM

## 2011-06-15 DIAGNOSIS — R569 Unspecified convulsions: Secondary | ICD-10-CM

## 2011-06-15 LAB — LIPID PANEL
Cholesterol: 165 mg/dL (ref 0–200)
Total CHOL/HDL Ratio: 2
Triglycerides: 33 mg/dL (ref 0.0–149.0)

## 2011-06-15 MED ORDER — LEVETIRACETAM 500 MG PO TABS: 500.0000 mg | ORAL_TABLET | Freq: Two times a day (BID) | ORAL | Status: DC

## 2011-06-15 NOTE — Assessment & Plan Note (Signed)
Off HCTZ since 05/2010 - self stopped Coreg 01/2011 Resumed tx with amlodipine 02/2011 - doing well The current medical regimen is effective;  continue present plan and medications.   BP Readings from Last 3 Encounters:  06/15/11 134/72  04/27/11 122/82  02/16/11 142/68

## 2011-06-15 NOTE — Assessment & Plan Note (Signed)
Check DEXA now - encouraged to maintain WB exercise and consider Ca+vit D supplements

## 2011-06-15 NOTE — Assessment & Plan Note (Signed)
Last lipids >63mo but very good HDL >90 - check again now Continue diet and exercise, never on meds for same

## 2011-06-15 NOTE — Patient Instructions (Signed)
It was good to see you today. Health Maintenance reviewed - all recommendations and immunizations are up to date.  Test(s) ordered today. Your results will be called to you after review (48-72hours after test completion). If any changes need to be made, you will be notified at that time. we'll make referral for bone density testing. Our office will contact you regarding appointment(s) once made. Medications reviewed, no changes at this time. Remember to take Keppra 2x.day    Calcium Intake Recommendations Age group / Amount of calcium to consume daily, in milligrams (mg)  0 to 6 months / 210 mg     7 to 12 months / 270 mg     1 to 3 years / 500 mg     4 to 8 years / 800 mg     9 to 18 years / 1,300 mg     19 to 50 years / 1,000 mg     51 to 70+ years / 1,200 mg     Pregnant and nursing, under 19 years / 1,300 mg     Pregnant and nursing, over 19 years / 1,000 mg  Document Released: 12/09/2003 Document Revised: 01/06/2011 Document Reviewed: 04/26/2005 [facility] Patient Information 2012 [place], [place].Osteoporosis Osteoporosis is a disease of the bones that makes them weaker and prone to break (fracture). By their mid-30s, most people begin to gradually lose bone strength. If this is severe enough, osteoporosis may occur. Osteopenia is a less severe weakness of the bones, which places you at risk for osteoporosis. It is important to identify if you have osteoporosis or osteopenia. Bone fractures from osteoporosis (especially hip and spine fractures) are a major cause of hospitalization, loss of independence, and can lead to life-threatening complications. CAUSES   There are a number of causes and risk factors:  Gender. Women are at a higher risk for osteoporosis than men.     Age. Bone formation slows down with age.     Ethnicity. For unclear reasons, white and Asian women are at higher risk for osteoporosis. Hispanic and African American women are at increased, but lesser, risk.      Family history of osteoporosis can mean that you are at a higher risk for getting it.     History of bone fractures indicates you may be at higher risk of another.     Calcium is very important for bone health and strength. Not enough calcium in your diet increases your risk for osteoporosis. Vitamin D is important for calcium metabolism. You get vitamin D from sunlight, foods, or supplements.     Physical activity. Bones get stronger with weight-bearing exercise and weaker without use.     Smoking is associated with decreased bone strength.     Medicines. Cortisone medicines, too much thyroid medicine, some cancer and seizure medicines, and others can weaken bones and cause osteoporosis.     Decreased body weight is associated with osteoporosis. The small amount of estrogen-type molecules produced in fat cells seems to protect the bones.     Menopausal decrease in the hormone estrogen can cause osteoporosis.     Low levels of the hormone testosterone can cause osteoporosis.     Some medical conditions can lead to osteoporosis (hyperthyroidism, hyperparathyroidism, B12 deficiency).  SYMPTOMS   Usually, no symptoms are felt as the bones weaken. The first symptoms are generally related to bone fractures. You may have silent, tiny bone fractures, especially in your spine. This can cause height loss and forward bending of  the spine (kyphosis). DIAGNOSIS   You or your caregiver may suspect osteoporosis based on height loss and kyphosis. Osteoporosis or osteopenia may be identified on an X-ray done for other reasons. A bone density measurement will likely be taken. Your bones are often measured at your lower spine or your hips. Measurement is done by an X-ray called a DEXA scan, or sometimes by a computerized X-ray scan (CT or CAT scan). Other tests may be done to find the cause of osteoporosis, such as blood tests to measure calcium and vitamin D, or to monitor treatment. TREATMENT   The goal  of osteoporosis treatment is to prevent fractures. This is done through medicine and home care treatments. Treatment will slow the weakening of your bones and strengthen them where possible. Measures to decrease the likelihood of falling and fracturing a bone are also important. Medicine  You may need supplements if you are not getting enough calcium, vitamin D, and vitamin B12.     If you are female and menopausal, you should discuss the option of estrogen replacement or estrogen-like medicine with your caregiver.     Medicines can be taken by mouth or injection to help build bone strength. When taken by mouth, there are important directions that you need to follow.     Calcitonin is a hormone made by the thyroid gland that can help build bone strength and decrease fracture risk in the spine. It can be taken by nasal spray or injection.     Parathyroid hormone can be injected to help build bone strength.     You will need to continue to get enough calcium intake with any of these medicines.  FALL PREVENTION  If you are unsteady on your feet, use a cane, walker, or walk with someone's help.     Remove loose rugs or electrical cords from your home.     Keep your home well lit at night. Use glasses if you need them.     Avoid icy streets and wet or waxed floors.     Hold the railing when using stairs.     Watch out for your pets.     Install grab bars in your bathroom.     Exercise. Physical activity, especially weight-bearing exercise, helps strengthen bones. Strength and balance exercise, such as tai chi, helps prevent falls.     Alcohol and some medicines can make you more likely to fall. Discuss alcohol use with your caregiver. Ask your caregiver if any of your medicines might increase your risk for falling. Ask if safer alternatives are available.  HOME CARE INSTRUCTIONS    Try to prevent and avoid falls.     To pick up objects, bend at the knees. Do not bend with your back.      Do not smoke. If you smoke, ask for help to stop.     Have adequate calcium and vitamin D in your diet. Talk with your caregiver about amounts.     Before exercising, ask your caregiver what exercises will be good for you.     Only take over-the-counter or prescription medicines for pain, discomfort, or fever as directed by your caregiver.  SEEK MEDICAL CARE IF:    You have had a fracture and your pain is not controlled.     You have had a fracture and you are not able to return to activities as expected.     You are reinjured.     You develop side effects from  medicines, especially stomach pain or trouble swallowing.     You develop new, unexplained problems.  SEEK IMMEDIATE MEDICAL CARE IF:    You develop sudden, severe pain in your back.     You develop pain after an injury or fall.  Document Released: 02/03/2005 Document Revised: 01/06/2011 Document Reviewed: 12/22/2006 [facility] Patient Information 2012 [place], [place].
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
## 2011-06-15 NOTE — Progress Notes (Signed)
Subjective:    Patient ID: April Conway, female    DOB: 14-Jun-1922, 78 y.o.   MRN: [id number]  HPI   Here for medicare wellness  Diet: heart healthy Physical activity: sedentary but appropriate Depression/mood screen: negative Hearing: intact to whispered voice Visual acuity: grossly normal, performs annual eye exam  ADLs: capable Fall risk: none Home safety: good Cognitive evaluation: intact to orientation, naming, recall and repetition EOL planning: adv directives discussed - pt to review with family  I have personally reviewed and have noted 1. The patient's medical and social history 2. Their use of alcohol, tobacco or illicit drugs 3. Their current medications and supplements 4. The patient's functional ability including ADL's, fall risks, home safety risks and hearing or visual impairment. 5. Diet and physical activities 6. Evidence for depression or mood disorders  Also reviewed chronic medical issues: Seizure - stopped dilantin 12/2010 due to rash - recurrent events per dtr: "small unresponsive events" in 02/2011 same as in 1999 when previously stopped dilantin >>started Keppra and doing well, no other breakthru events  HTN - also stopped coreg 12/2010 due to ?SE - no edema - no headache, edema or or chest pain  Started amlodipine 02/2011 and doing well without adverse side effects   Osteopenia - pt declines calcium - WB activities ongoing  periodic R lateral thigh pain from buttock to knee - no falls or injury - denies specific back pain or joint swelling, unimproved with meloxicam  Past Medical History  Diagnosis Date  . CONDUCTIVE HEARING LOSS BILATERAL   . OSTEOPENIA     DEXA 08/2005: -1.7 L spine  . URINARY INCONTINENCE, MIXED   . DYSLIPIDEMIA   . HYPERTENSION   . SEIZURE DISORDER 1965    bkthru 1999, then 02/2011 following dc of AEDs  . GEN OSTEOARTHROSIS INVOLVING MULTIPLE SITES    Family History  Problem Relation Age of Onset  . Diabetes Father     History  Substance Use Topics  . Smoking status: Never Smoker   . 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 One daughter nearby calls every night, other dgt in [place] area  . Alcohol Use: No    Review of Systems  Constitutional: Negative for fever and unexpected weight change.  Cardiovascular: Negative for chest pain.  Neurological: Negative for seizures, syncope and headaches.  Respiratory: Negative for cough and shortness of breath.   Gastrointestinal: Negative for abdominal pain, no bowel changes.  Musculoskeletal: Negative for gait problem or joint swelling.  Skin: Negative for rash.  No other specific complaints in a complete review of systems (except as listed in HPI above).      Objective:   Physical Exam  BP 134/72  Pulse 108  Temp(Src) 98.2 F (36.8 C) (Oral)  Wt 157 lb (71.215 kg)  SpO2 92% BP Readings from Last 3 Encounters:  06/15/11 134/72  04/27/11 122/82  02/16/11 142/68   Constitutional: She appears well-developed and well-nourished. No distress.  HENT: Head: Normocephalic. Mouth/Throat: No oropharyngeal exudate or redness.       No hearing on left side (chronic), B hearing aides present  Neck: Normal range of motion. Neck supple. No thyromegaly present.  Cardiovascular: Normal rate, regular rhythm and normal heart sounds. no rub or murmur heard. no BLE edema Pulmonary/Chest: Effort normal and breath sounds normal. No respiratory distress or wheeze.  Mskel: chronically enlarged R knee with boggy synovitis but FROM, RLE sciatica pain distribution - hip with FROM, nonpainful to  palpation over R groin or vertebra Psychiatric: She has a normal mood and affect. Her behavior is normal.   Very pleasant and bright  Neurology - CN2-12 symmetrically intact; MAE with good strength; cognition, balance and memory normal  Lab Results  Component Value Date   WBC 6.8 01/12/2011   HGB 12.0 01/12/2011   HCT 35.4* 01/12/2011   PLT  207.0 01/12/2011   CHOL 241* 08/12/2009   TRIG 70.0 08/12/2009   HDL 96.60 08/12/2009   LDLDIRECT 139.9 08/12/2009   ALT 11 01/12/2011   AST 15 01/12/2011   NA 139 01/12/2011   K 4.9 01/12/2011   CL 104 01/12/2011   CREATININE 0.6 01/12/2011   BUN 17 01/12/2011   CO2 30 01/12/2011   TSH 1.54 01/12/2011   HGBA1C 6.0 08/05/2008       Assessment & Plan:  AWV - v70.0 - Today patient counseled on age appropriate routine health concerns for screening and prevention, each reviewed and up to date or declined. Immunizations reviewed and up to date or declined. Labs reviewed. Risk factors for depression reviewed and negative. Hearing function unchanged and visual acuity are intact. ADLs screened and addressed as needed. Functional ability and level of safety reviewed and appropriate. Education, counseling and referrals performed based on assessed risks today. Patient provided with a copy of personalized plan for preventive services.   Also See problem list. Medications and labs reviewed today.

## 2011-07-06 ENCOUNTER — Encounter: Payer: Self-pay | Admitting: Internal Medicine

## 2011-08-22 ENCOUNTER — Other Ambulatory Visit (HOSPITAL_COMMUNITY): Payer: Medicare Other

## 2011-08-22 ENCOUNTER — Emergency Department (HOSPITAL_COMMUNITY): Payer: Medicare Other

## 2011-08-22 ENCOUNTER — Inpatient Hospital Stay (HOSPITAL_COMMUNITY): Payer: Medicare Other

## 2011-08-22 ENCOUNTER — Encounter (HOSPITAL_COMMUNITY): Payer: Self-pay

## 2011-08-22 ENCOUNTER — Inpatient Hospital Stay (HOSPITAL_COMMUNITY)
Admission: EM | Admit: 2011-08-22 | Discharge: 2011-08-25 | DRG: 312 | Disposition: A | Discharge status: Home Health | Payer: Medicare Other | Attending: Family Medicine | Admitting: Family Medicine

## 2011-08-22 DIAGNOSIS — M899 Disorder of bone, unspecified: Secondary | ICD-10-CM | POA: Diagnosis present

## 2011-08-22 DIAGNOSIS — R42 Dizziness and giddiness: Secondary | ICD-10-CM

## 2011-08-22 DIAGNOSIS — M949 Disorder of cartilage, unspecified: Secondary | ICD-10-CM

## 2011-08-22 DIAGNOSIS — Z87820 Personal history of traumatic brain injury: Secondary | ICD-10-CM

## 2011-08-22 DIAGNOSIS — S069XAA Unspecified intracranial injury with loss of consciousness status unknown, initial encounter: Secondary | ICD-10-CM | POA: Diagnosis present

## 2011-08-22 DIAGNOSIS — E785 Hyperlipidemia, unspecified: Secondary | ICD-10-CM | POA: Diagnosis present

## 2011-08-22 DIAGNOSIS — S069X9A Unspecified intracranial injury with loss of consciousness of unspecified duration, initial encounter: Secondary | ICD-10-CM

## 2011-08-22 DIAGNOSIS — Z79899 Other long term (current) drug therapy: Secondary | ICD-10-CM

## 2011-08-22 DIAGNOSIS — G40909 Epilepsy, unspecified, not intractable, without status epilepticus: Secondary | ICD-10-CM | POA: Diagnosis present

## 2011-08-22 DIAGNOSIS — H900 Conductive hearing loss, bilateral: Secondary | ICD-10-CM | POA: Diagnosis present

## 2011-08-22 DIAGNOSIS — I10 Essential (primary) hypertension: Secondary | ICD-10-CM | POA: Diagnosis present

## 2011-08-22 DIAGNOSIS — R55 Syncope and collapse: Principal | ICD-10-CM | POA: Diagnosis present

## 2011-08-22 DIAGNOSIS — R569 Unspecified convulsions: Secondary | ICD-10-CM | POA: Diagnosis present

## 2011-08-22 DIAGNOSIS — M159 Polyosteoarthritis, unspecified: Secondary | ICD-10-CM | POA: Diagnosis present

## 2011-08-22 DIAGNOSIS — N3946 Mixed incontinence: Secondary | ICD-10-CM | POA: Diagnosis present

## 2011-08-22 LAB — CBC
HCT: 41.4 % (ref 36.0–46.0)
Hemoglobin: 14.2 g/dL (ref 12.0–15.0)
MCHC: 34.3 g/dL (ref 30.0–36.0)
WBC: 8 10*3/uL (ref 4.0–10.5)

## 2011-08-22 LAB — URINALYSIS, ROUTINE W REFLEX MICROSCOPIC
Glucose, UA: NEGATIVE mg/dL
Ketones, ur: NEGATIVE mg/dL
Leukocytes, UA: NEGATIVE
Specific Gravity, Urine: 1.011 (ref 1.005–1.030)
pH: 7.5 (ref 5.0–8.0)

## 2011-08-22 LAB — DIFFERENTIAL
Lymphocytes Relative: 17 % (ref 12–46)
Monocytes Absolute: 0.5 10*3/uL (ref 0.1–1.0)
Monocytes Relative: 6 % (ref 3–12)
Neutro Abs: 6 10*3/uL (ref 1.7–7.7)

## 2011-08-22 LAB — COMPREHENSIVE METABOLIC PANEL
BUN: 8 mg/dL (ref 6–23)
CO2: 29 mEq/L (ref 19–32)
Chloride: 102 mEq/L (ref 96–112)
Creatinine, Ser: 0.57 mg/dL (ref 0.50–1.10)
GFR calc non Af Amer: 81 mL/min — ABNORMAL LOW (ref >90)
Glucose, Bld: 102 mg/dL — ABNORMAL HIGH (ref 70–99)
Total Bilirubin: 0.5 mg/dL (ref 0.3–1.2)

## 2011-08-22 LAB — TSH: TSH: 1.47 u[IU]/mL (ref 0.350–4.500)

## 2011-08-22 LAB — PROTIME-INR: Prothrombin Time: 13.3 seconds (ref 11.6–15.2)

## 2011-08-22 LAB — POCT I-STAT, CHEM 8
Creatinine, Ser: 0.7 mg/dL (ref 0.50–1.10)
Glucose, Bld: 105 mg/dL — ABNORMAL HIGH (ref 70–99)
Hemoglobin: 15 g/dL (ref 12.0–15.0)
Potassium: 3.6 mEq/L (ref 3.5–5.1)
TCO2: 29 mmol/L (ref 0–100)

## 2011-08-22 LAB — CK TOTAL AND CKMB (NOT AT ARMC)
CK, MB: 2.4 ng/mL (ref 0.3–4.0)
Total CK: 60 U/L (ref 7–177)

## 2011-08-22 LAB — APTT: aPTT: 29 seconds (ref 24–37)

## 2011-08-22 MED ORDER — ONDANSETRON HCL 4 MG PO TABS: 4.0000 mg | ORAL_TABLET | Freq: Four times a day (QID) | ORAL | Status: DC | PRN

## 2011-08-22 MED ORDER — ASPIRIN 300 MG RE SUPP
300.0000 mg | Freq: Every day | RECTAL | Status: DC
  Filled 2011-08-22 (×3): qty 1

## 2011-08-22 MED ORDER — HEPARIN SODIUM (PORCINE) 5000 UNIT/ML IJ SOLN
5000.0000 [IU] | Freq: Three times a day (TID) | INTRAMUSCULAR | Status: DC
  Administered 2011-08-22 – 2011-08-25 (×9): 5000 [IU] via SUBCUTANEOUS
  Filled 2011-08-22 (×12): qty 1

## 2011-08-22 MED ORDER — ASPIRIN 325 MG PO TABS
325.0000 mg | ORAL_TABLET | Freq: Every day | ORAL | Status: DC
  Administered 2011-08-22 – 2011-08-25 (×4): 325 mg via ORAL
  Filled 2011-08-22 (×4): qty 1

## 2011-08-22 MED ORDER — CALCIUM CARBONATE 600 MG PO TABS
600.0000 mg | ORAL_TABLET | Freq: Every day | ORAL | Status: DC
  Filled 2011-08-22: qty 1

## 2011-08-22 MED ORDER — ONDANSETRON HCL 4 MG/2ML IJ SOLN: 4.0000 mg | Freq: Four times a day (QID) | INTRAMUSCULAR | Status: DC | PRN

## 2011-08-22 MED ORDER — HYDROCODONE-ACETAMINOPHEN 5-325 MG PO TABS: 1.0000 | ORAL_TABLET | ORAL | Status: DC | PRN

## 2011-08-22 MED ORDER — GUAIFENESIN-DM 100-10 MG/5ML PO SYRP: 5.0000 mL | ORAL_SOLUTION | ORAL | Status: DC | PRN

## 2011-08-22 MED ORDER — SENNOSIDES-DOCUSATE SODIUM 8.6-50 MG PO TABS
1.0000 | ORAL_TABLET | Freq: Every evening | ORAL | Status: DC | PRN
  Filled 2011-08-22: qty 1

## 2011-08-22 MED ORDER — CALCIUM CARBONATE 1250 (500 CA) MG PO TABS
1.0000 | ORAL_TABLET | Freq: Every day | ORAL | Status: DC
  Administered 2011-08-22 – 2011-08-25 (×4): 500 mg via ORAL
  Filled 2011-08-22 (×4): qty 1

## 2011-08-22 MED ORDER — SODIUM CHLORIDE 0.9 % IJ SOLN
3.0000 mL | Freq: Two times a day (BID) | INTRAMUSCULAR | Status: DC
  Administered 2011-08-22 – 2011-08-24 (×5): 3 mL via INTRAVENOUS

## 2011-08-22 MED ORDER — ASPIRIN EC 81 MG PO TBEC
81.0000 mg | DELAYED_RELEASE_TABLET | Freq: Every day | ORAL | Status: DC
  Filled 2011-08-22: qty 1

## 2011-08-22 MED ORDER — ALBUTEROL SULFATE (5 MG/ML) 0.5% IN NEBU: 2.5000 mg | INHALATION_SOLUTION | RESPIRATORY_TRACT | Status: DC | PRN

## 2011-08-22 MED ORDER — LEVETIRACETAM 500 MG PO TABS
500.0000 mg | ORAL_TABLET | Freq: Two times a day (BID) | ORAL | Status: DC
  Administered 2011-08-22 – 2011-08-25 (×6): 500 mg via ORAL
  Filled 2011-08-22 (×7): qty 1
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
## 2011-08-22 NOTE — ED Provider Notes (Signed)
History     CSN: [id number]  Arrival date & time 08/22/11  [id number]   First MD Initiated Contact with Patient 08/22/11 [phone]      Chief Complaint  Patient presents with  . Code Stroke    (Consider location/radiation/quality/duration/timing/severity/associated sxs/prior treatment) The history is provided by the patient and the EMS personnel.   the patient is an 78 year old, female, with a history of hypertension, and seizures, who presents to emergency department complaining of dizziness since last night.  This morning.  She was noted to have left facial droop around 8:00.  She denies pain anywhere.  She denies weakness in her arms or legs.  She denies any vision changes.  She says last night.  When she was walking.  She had to hold onto the wall, so she would not fall.  Level V caveat applies for urgent need for intervention.  She was brought in as a code stroke  9:16 AM Pt returned from ct, per protocol, more hx obtained.  No pain. No n/v/vision changes or weakness.  Does not feel dizzy now. Denies recent illness.  No uti sxs.   Past Medical History  Diagnosis Date  . CONDUCTIVE HEARING LOSS BILATERAL   . OSTEOPENIA     DEXA 08/2005: -1.7 L spine  . URINARY INCONTINENCE, MIXED   . DYSLIPIDEMIA   . HYPERTENSION   . SEIZURE DISORDER 1965    bkthru 1999, then 02/2011 following dc of AEDs  . GEN OSTEOARTHROSIS INVOLVING MULTIPLE SITES     Past Surgical History  Procedure Date  . Appendectomy 1940  . Abdominal hysterectomy 1991  . Tonsillectomy 1934  . Left wrist  06/2002    S/P ORIF-Gramig  . Cataract extraction     Family History  Problem Relation Age of Onset  . Diabetes Father     History  Substance Use Topics  . Smoking status: Never Smoker   . 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 One daughter nearby calls every night, other dgt in [place] area  . Alcohol Use: No    OB History    Grav Para Term Preterm Abortions TAB SAB  Ect Mult Living                  Review of Systems  Constitutional: Negative for fever and chills.  HENT: Negative for congestion.   Eyes: Negative for visual disturbance.  Respiratory: Negative for cough and shortness of breath.   Cardiovascular: Negative for chest pain.  Gastrointestinal: Negative for nausea, vomiting, abdominal pain and diarrhea.  Genitourinary: Negative for dysuria.  Neurological: Positive for dizziness. Negative for headaches.  Psychiatric/Behavioral: Negative for confusion.  All other systems reviewed and are negative.    Allergies  Review of patient's allergies indicates no known allergies.  Home Medications   Current Outpatient Rx  Name Route Sig Dispense Refill  . AMLODIPINE BESYLATE 5 MG PO TABS Oral Take 1 tablet (5 mg total) by mouth daily. 30 tablet 11  . LEVETIRACETAM 500 MG PO TABS Oral Take 1 tablet (500 mg total) by mouth 2 (two) times daily. 60 tablet 11    SpO2 99%  Physical Exam  Vitals reviewed. Constitutional: She is oriented to person, place, and time. She appears well-developed and well-nourished. No distress.  HENT:  Head: Normocephalic and atraumatic.  Eyes: Conjunctivae and EOM are normal.  Neck: Normal range of motion. Neck supple.       No bruits  Cardiovascular: Normal rate.  No murmur heard. Pulmonary/Chest: Effort normal and breath sounds normal.  Abdominal: Soft. There is no tenderness. There is no guarding.  Musculoskeletal: Normal range of motion. She exhibits no edema and no tenderness.  Neurological: She is alert and oriented to person, place, and time. A cranial nerve deficit is present.       Slight facial droop on the left-hand side Clear.  Speech 5 over 5 strength in all her upper and lower extremities +  Nystagmus - lateral  Skin: Skin is warm and dry.  Psychiatric: She has a normal mood and affect. Thought content normal.    ED Course  Procedures (including critical care time) 78 year old, female,  with dizziness since last night and slight left facial droop.  Code Stroke called.  Labs Reviewed  POCT I-STAT, CHEM 8 - Abnormal; Notable for the following:    Glucose, Bld 105 (*)    All other components within normal limits  PROTIME-INR  APTT  CBC  DIFFERENTIAL  COMPREHENSIVE METABOLIC PANEL  CK TOTAL AND CKMB  TROPONIN I   No results found.   No diagnosis found.  ED ECG REPORT   Date: 08/22/2011  EKG Time: 9:18 AM  Rate: 71  Rhythm: normal sinus rhythm and premature ventricular contractions (PVC),    Axis: nl  Intervals:none  ST&T Change: none  Narrative Interpretation: nsr with pvc            9:09 AM The neurologist, came to evaluate the patient.  He canceled the code stroke, and thinks she should be evaluated for syncope.  11:10 AM Spoke with triad. He will admit  MDM  Dizziness, resolved. No stroke.          Cheri Guppy, MD 08/22/11 1110

## 2011-08-22 NOTE — ED Notes (Signed)
[id number] Ready 

## 2011-08-22 NOTE — Consult Note (Signed)
Chief Complaint: "episode of unresponsiveness, slurred speech and confusion"  HPI: April Conway is an 78 y.o. female who had an episode of slurred speech and confusion for several minutes this am in a setting of dizziness. Patient had complained of dizziness since yesterday night. 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 NIHSS of 2.  LSN: 8:15 am tPA Given: No: low NIHSS mRankin: 0  Past Medical History  Diagnosis Date  . CONDUCTIVE HEARING LOSS BILATERAL   . OSTEOPENIA     DEXA 08/2005: -1.7 L spine  . URINARY INCONTINENCE, MIXED   . DYSLIPIDEMIA   . HYPERTENSION   . SEIZURE DISORDER 1965    bkthru 1999, then 02/2011 following dc of AEDs  . GEN OSTEOARTHROSIS INVOLVING MULTIPLE SITES    Past Surgical History  Procedure Date  . Appendectomy 1940  . Abdominal hysterectomy 1991  . Tonsillectomy 1934  . Left wrist  06/2002    S/P ORIF-Gramig  . Cataract extraction    Family History  Problem Relation Age of Onset  . Diabetes Father    Social History:  reports that she has never smoked. She does not have any smokeless tobacco history on file. She reports that she does not drink alcohol or use illicit drugs.  Allergies: No Known Allergies  Medications: I have reviewed the patient's current medications.  ROS: as above  Physical Examination: Blood pressure 170/67, pulse 71, temperature 97.5 F (36.4 C), temperature source Oral, resp. rate 13, height 5\' 7"  (1.702 m), weight 72.576 kg (160 lb), SpO2 97.00%.  Neurologic Examination: MS: AAO*3, no aphasia, followed complex commands CN: EOMI, PERRL, VFF, mild left facial droop, V1-V3 sensation intact, no dysarthria Motor: mild left arm drift, strength was 5/5 throughout Sensory: no deficit to LT/PP Coord: F to N intact b/l Reflexes: 1+ throughout, mute plantars b/l Gait: deferred  Results for orders placed during the hospital encounter of 08/22/11 (from the past 48 hour(s))    PROTIME-INR     Status: Normal   Collection Time   08/22/11  8:48 AM      Component Value Range Comment   Prothrombin Time 12.7  11.6 - 15.2 (seconds)    INR 0.93  0.00 - 1.49    APTT     Status: Normal   Collection Time   08/22/11  8:48 AM      Component Value Range Comment   aPTT 29  24 - 37 (seconds)   CBC     Status: Normal   Collection Time   08/22/11  8:48 AM      Component Value Range Comment   WBC 8.0  4.0 - 10.5 (K/uL)    RBC 4.40  3.87 - 5.11 (MIL/uL)    Hemoglobin 14.2  12.0 - 15.0 (g/dL)    HCT 16.1  09.6 - 04.5 (%)    MCV 94.1  78.0 - 100.0 (fL)    MCH 32.3  26.0 - 34.0 (pg)    MCHC 34.3  30.0 - 36.0 (g/dL)    RDW 40.9  81.1 - 91.4 (%)    Platelets 268  150 - 400 (K/uL)   DIFFERENTIAL     Status: Normal   Collection Time   08/22/11  8:48 AM      Component Value Range Comment   Neutrophils Relative 75  43 - 77 (%)    Neutro Abs 6.0  1.7 - 7.7 (K/uL)    Lymphocytes Relative 17  12 -  46 (%)    Lymphs Abs 1.4  0.7 - 4.0 (K/uL)    Monocytes Relative 6  3 - 12 (%)    Monocytes Absolute 0.5  0.1 - 1.0 (K/uL)    Eosinophils Relative 1  0 - 5 (%)    Eosinophils Absolute 0.1  0.0 - 0.7 (K/uL)    Basophils Relative 0  0 - 1 (%)    Basophils Absolute 0.0  0.0 - 0.1 (K/uL)   COMPREHENSIVE METABOLIC PANEL     Status: Abnormal   Collection Time   08/22/11  8:48 AM      Component Value Range Comment   Sodium 141  135 - 145 (mEq/L)    Potassium 3.6  3.5 - 5.1 (mEq/L)    Chloride 102  96 - 112 (mEq/L)    CO2 29  19 - 32 (mEq/L)    Glucose, Bld 102 (*) 70 - 99 (mg/dL)    BUN 8  6 - 23 (mg/dL)    Creatinine, Ser 4.09  0.50 - 1.10 (mg/dL)    Calcium 9.9  8.4 - 10.5 (mg/dL)    Total Protein 7.9  6.0 - 8.3 (g/dL)    Albumin 4.2  3.5 - 5.2 (g/dL)    AST 18  0 - 37 (U/L)    ALT 12  0 - 35 (U/L)    Alkaline Phosphatase 91  39 - 117 (U/L)    Total Bilirubin 0.5  0.3 - 1.2 (mg/dL)    GFR calc non Af Amer 81 (*) >90 (mL/min)    GFR calc Af Amer >90  >90 (mL/min)   POCT I-STAT,  CHEM 8     Status: Abnormal   Collection Time   08/22/11  8:57 AM      Component Value Range Comment   Sodium 142  135 - 145 (mEq/L)    Potassium 3.6  3.5 - 5.1 (mEq/L)    Chloride 103  96 - 112 (mEq/L)    BUN 7  6 - 23 (mg/dL)    Creatinine, Ser 8.11  0.50 - 1.10 (mg/dL)    Glucose, Bld 914 (*) 70 - 99 (mg/dL)    Calcium, Ion 7.82  1.12 - 1.32 (mmol/L)    TCO2 29  0 - 100 (mmol/L)    Hemoglobin 15.0  12.0 - 15.0 (g/dL)    HCT 95.6  21.3 - 08.6 (%)   GLUCOSE, CAPILLARY     Status: Normal   Collection Time   08/22/11  9:12 AM      Component Value Range Comment   Glucose-Capillary 99  70 - 99 (mg/dL)    Comment 1 Notify RN      Ct Head Wo Contrast  08/22/2011  *RADIOLOGY REPORT*  Clinical Data: 78 year old female with dizziness.  CT HEAD WITHOUT CONTRAST  Technique:  Contiguous axial images were obtained from the base of the skull through the vertex without contrast.  Comparison: None  Findings: Bilateral temporal and parietal encephalomalacia changes are identified. Bilateral craniectomy defects are present.  Mild chronic small vessel white matter ischemic changes are noted.  No acute intracranial abnormalities are identified, including mass lesion or mass effect, hydrocephalus, extra-axial fluid collection, midline shift, hemorrhage, or acute infarction.  The visualized bony calvarium is unremarkable. A left mastoid effusion is noted.  IMPRESSION: No evidence of acute intracranial abnormality.  Encephalomalacia and chronic small vessel white matter ischemic changes.  Left mastoid effusion.  Original Report Authenticated By: Rosendo Gros, M.D.   Assessment: 78  y.o. female who presents with slurred speech, dizziness, confusion eyes were open open, but no gaze deviation or shaking = I suspect a vascular or syncopal event  Stroke Risk Factors - hypertension  Plan: 1. HgbA1c, fasting lipid panel 2. MRI, MRA  of the brain without contrast 3. PT consult, OT consult, Speech consult 4.  Echocardiogram 5. Carotid dopplers 6. Prophylactic therapy-Antiplatelet med: Aspirin - home dose 7. Risk factor modification 8. Cardiac Monitoring/Syncope work-up  Threasa Kinch 08/22/2011, 10:06 AM
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
## 2011-08-22 NOTE — ED Notes (Signed)
Pt called family this morning 0630 and she sounded/seemed fine other than she was dizzy and wanted to let them know she was not going to church.  Called again at 0700 daughter told pt she was coming over and daughter also called pt's PCP.  PCP called pt while daughter was on way to hospital.  When daughter arrived she returned call to PCP and was told to call EMS.  When daughter arrived pt had dizziness could not ambulate.  When EMS arrived pt began having slurred speech and a marked change in behavior.  Per daughter, pt was not responding to stimuli, but was conscious.  Pt was talking "gibberish".  Pt did "come out of it" and began speaking clearly.  Daughter reports hx of seizures.  Reports that seizures normally are slow to begin and there is noticeable change in pt behavior prior to seizure activity.  This time the change was very sudden.  Last major seizure with foaming of mouth and jerking was in 1999.  Pt had a mild seizure in 02/2011.

## 2011-08-22 NOTE — Progress Notes (Signed)
08/22/11 1136  Discharge Planning  Type of Residence Private residence  Living Arrangements Alone  Home Care Services No  Support Systems Children;Other relatives  Do you have any problems obtaining your medications? No  Once you are discharged, how will you get to your follow-up appointment? Family  Expected Discharge Date 08/25/11  Case Management Consult Needed Yes (Comment)  Social Work Consult Needed Yes (Comment)    Dionne Milo MSW [facility] Emergency Dept. Weekend/Social Worker [phone]

## 2011-08-22 NOTE — Code Documentation (Addendum)
Code stroke called by EMS at 0829, Patient arrived to [facility] at [phone], EDP seen at [phone], stroke team at [phone], patient in CT at 0844, Labs at [phone], CT read at [phone],.  As per patient she went to bed at 2000 last night and woke up many times to use the bathroom and each time on getting up got very dizzy.  She woke up this morning and was still dizzy, She then called her daughter who then called EMS. LSN at 0815,  While EMS was there doing an EKG patient had sudden unresponsiveness and came to with slurred speech, and facial droop and some left leg weakness.  NIHSS 2.  Cancelled at [phone]

## 2011-08-22 NOTE — Progress Notes (Signed)
08/22/11 1137  OTHER  CSW Follow Up Status No follow-up needed (Consult LCSW if disposition needs arise)    Dionne Milo MSW [facility] Emergency Dept. Weekend/Social Worker [phone]

## 2011-08-23 ENCOUNTER — Encounter (HOSPITAL_COMMUNITY): Payer: Self-pay | Admitting: Radiology

## 2011-08-23 ENCOUNTER — Telehealth: Payer: Self-pay

## 2011-08-23 ENCOUNTER — Inpatient Hospital Stay (HOSPITAL_COMMUNITY): Payer: Medicare Other

## 2011-08-23 LAB — CBC
HCT: 37.8 % (ref 36.0–46.0)
MCH: 31.3 pg (ref 26.0–34.0)
MCHC: 33.3 g/dL (ref 30.0–36.0)
MCV: 94 fL (ref 78.0–100.0)
Platelets: 245 10*3/uL (ref 150–400)
RDW: 13.3 % (ref 11.5–15.5)
WBC: 6 10*3/uL (ref 4.0–10.5)

## 2011-08-23 LAB — LIPID PANEL
HDL: 77 mg/dL (ref >39)
LDL Cholesterol: 85 mg/dL (ref 0–99)

## 2011-08-23 LAB — BASIC METABOLIC PANEL
BUN: 15 mg/dL (ref 6–23)
CO2: 26 mEq/L (ref 19–32)
Calcium: 9.7 mg/dL (ref 8.4–10.5)
Chloride: 103 mEq/L (ref 96–112)
Creatinine, Ser: 0.67 mg/dL (ref 0.50–1.10)
Glucose, Bld: 87 mg/dL (ref 70–99)

## 2011-08-23 LAB — HEMOGLOBIN A1C: Mean Plasma Glucose: 120 mg/dL — ABNORMAL HIGH (ref <117)

## 2011-08-23 MED ORDER — IOHEXOL 350 MG/ML SOLN
50.0000 mL | Freq: Once | INTRAVENOUS | Status: AC | PRN
  Administered 2011-08-23: 50 mL via INTRAVENOUS

## 2011-08-23 NOTE — Evaluation (Signed)
Physical Therapy Evaluation Patient Details Name: April Conway MRN: [id number] DOB: 1922-05-22 Today's Date: 08/23/2011  Problem List:  Patient Active Problem List  Diagnoses  . DYSLIPIDEMIA  . CONDUCTIVE HEARING LOSS BILATERAL  . HYPERTENSION  . GEN OSTEOARTHROSIS INVOLVING MULTIPLE SITES  . OSTEOPENIA  . SEIZURE DISORDER  . URINARY INCONTINENCE, MIXED  . Syncope  . TBI (traumatic brain injury)    Past Medical History:  Past Medical History  Diagnosis Date  . CONDUCTIVE HEARING LOSS BILATERAL   . OSTEOPENIA     DEXA 08/2005: -1.7 L spine  . URINARY INCONTINENCE, MIXED   . DYSLIPIDEMIA   . HYPERTENSION   . SEIZURE DISORDER 1965    bkthru 1999, then 02/2011 following dc of AEDs  . GEN OSTEOARTHROSIS INVOLVING MULTIPLE SITES    Past Surgical History:  Past Surgical History  Procedure Date  . Appendectomy 1940  . Abdominal hysterectomy 1991  . Tonsillectomy 1934  . Left wrist  06/2002    S/P ORIF-Gramig  . Cataract extraction   . Fracture surgery     PT Assessment/Plan/Recommendation PT Assessment Clinical Impression Statement: Patient presents with NIHSS 2 with slurred speech and confusion. Neuro MD suspecting syncope/vascular event. Currently patient is requiring assistance or close contact with gait secondary to decreased safety due to poor balance. Patients desire is to return home. To return home safely patient will require supervision for mobility until she makes improvements in her balance which is evidently increasing since admission. We will follow acutely to maximize functional independence and safety. PT Recommendation/Assessment: Patient will need skilled PT in the acute care venue PT Problem List: Decreased strength;Decreased activity tolerance;Decreased balance;Decreased mobility;Decreased knowledge of precautions;Decreased knowledge of use of DME PT Therapy Diagnosis : Abnormality of gait;Difficulty walking;Generalized weakness PT Plan PT Frequency: Min  3X/week PT Treatment/Interventions: DME instruction;Gait training;Stair training;Therapeutic activities;Functional mobility training;Therapeutic exercise;Balance training;Patient/family education PT Recommendation Follow Up Recommendations: Home health PT;Supervision for mobility/OOB Equipment Recommended: 3 in 1 bedside comode PT Goals  Acute Rehab PT Goals PT Goal Formulation: With patient Time For Goal Achievement: 7 days Pt will go Sit to Stand: with modified independence;with upper extremity assist PT Goal: Sit to Stand - Progress: Goal set today Pt will go Stand to Sit: with modified independence;with upper extremity assist PT Goal: Stand to Sit - Progress: Goal set today Pt will Ambulate: >150 feet;with modified independence;with least restrictive assistive device PT Goal: Ambulate - Progress: Goal set today Pt will Go Up / Down Stairs: 1-2 stairs;with min assist PT Goal: Up/Down Stairs - Progress: Goal set today Pt will Perform Home Exercise Program: with supervision, verbal cues required/provided PT Goal: Perform Home Exercise Program - Progress: Goal set today Additional Goals Additional Goal #1: Patient will present with Berg balance score of at least 37 to inidicate less future falls risk PT Goal: Additional Goal #1 - Progress: Goal set today  PT Evaluation Precautions/Restrictions   Falls Prior Functioning  Home Living Lives With: Alone Available Help at Discharge: Family;Available PRN/intermittently Type of Home: House Home Access: Stairs to enter [employer] of Steps: 1 - front(main level)  or level entry at back (into basement) Home Layout: Two level (basement apartment) Alternate Level Stairs-Number of Steps: does not have to use basement Bathroom Shower/Tub: Engineer, manufacturing systems: Handicapped height Home Adaptive Equipment: Straight cane;Walker - rolling Prior Function Level of Independence: Independent with assistive device(s)  (cane) Able to Take Stairs?: Yes Driving: Yes Vocation: Retired Financial risk analyst Arousal/Alertness: Awake/alert Overall Cognitive Status: Appears within  functional limits for tasks assessed Orientation Level: Oriented X4 Sensation/Coordination Sensation Light Touch: Appears Intact Proprioception: Appears Intact Coordination Gross Motor Movements are Fluid and Coordinated: Yes Fine Motor Movements are Fluid and Coordinated: Yes Extremity Assessment RLE Assessment RLE Assessment: Exceptions to [facility] RLE AROM (degrees) Overall AROM Right Lower Extremity: Within functional limits for tasks assessed RLE Strength RLE Overall Strength: Deficits;Due to premorbid status RLE Overall Strength Comments: Grossly 4/5 LLE Assessment LLE Assessment: Within Functional Limits Mobility (including Balance) Bed Mobility Bed Mobility: Yes Supine to Sit: 6: Modified independent (Device/Increase time) Sitting - Scoot to Edge of Bed: 6: Modified independent (Device/Increase time) Sit to Supine: 6: Modified independent (Device/Increase time) Transfers Transfers: Yes Sit to Stand: With upper extremity assist;From bed;Other (comment) Sit to Stand Details (indicate cue type and reason): Min-guard assistance - patient requires upper extremity support to stabilize. Improved safety with stability to walker rather than cane. With cane patient utilizes PT for support. Stand to Sit: 5: Supervision;With upper extremity assist;To bed Ambulation/Gait Ambulation/Gait: Yes Ambulation/Gait Assistance: Other (comment);4: Min assist Ambulation/Gait Assistance Details (indicate cue type and reason): with cane minimal assistance 50 feet - wide base of support, limited hip and knee flexion and decreased foot clearance. With walker 30 feet - improved safety and balance and narrower base of support with improved foot clearance. Assistive device: Rolling walker;Straight cane Gait Pattern: Decreased dorsiflexion -  right;Decreased dorsiflexion - left;Decreased hip/knee flexion - right;Decreased hip/knee flexion - left;Step-through pattern;Decreased stride length  Posture/Postural Control Posture/Postural Control: Postural limitations Postural Limitations: Unable to stand unaided secondary to posterior bias End of Session PT - End of Session Equipment Utilized During Treatment: Gait belt Activity Tolerance: Patient tolerated treatment well Patient left: in bed;with call bell in reach;with bed alarm set Nurse Communication: Mobility status for ambulation General Behavior During Session: [facility] for tasks performed Cognition: [facility] for tasks performed Edwyna Perfect, PT  Pager [phone]  08/23/2011, 8:43 AM

## 2011-08-23 NOTE — Telephone Encounter (Signed)
[facility] Triage Call Report Triage Record Num: [id number] Operator: Claudie Leach Patient Name: Edit Ricciardelli Call Date & Time: 08/22/2011 7:14:31AM Patient Phone: [phone] PCP: Rene Paci Patient Gender: Female PCP Fax : [phone] Patient DOB: [birth year] Practice Name: [facility] Reason for Call: Caller: Ruth/Other; PCP: Rene Paci; CB#: [phone]; Call regarding Dizziness that started on 08/21/11. Was urinating alot on 08/21/11 and each time she got up to urinate the dizziness got worse. No c/o headache. No c/o Chest Pain. States she is severely dizzy. Triaged per Dizziness or Vertigo guideline. To call 911 due to new or worsening s/s that may indicate Shock. Care advice given. Instructed to call 911 and caller states she will. Protocol(s) Used: Dizziness or Vertigo Recommended Outcome per Protocol: Activate EMS 911 Reason for Outcome: New or worsening signs and symptoms that may indicate shock Care Advice: ~ Do not give the patient anything to eat or drink. ~ An adult should stay with the patient, preferably one trained in CPR. Lay the person down and elevate legs at least 12 inches (30 cm) above level of heart. Cover to help maintain body temperature. ~ ~ IMMEDIATE ACTION Write down provider's name. List or place the following in a bag for transport with the patient: current prescription and/or nonprescription medications; alternative treatments, therapies and medications; and street drugs. ~ 08/22/2011 7:48:31AM Page 1 of 1 [email or web address]

## 2011-08-23 NOTE — Progress Notes (Signed)
April Conway [id number] is a 78 y.o. female,  Outpatient Primary MD for the patient is Rene Paci, MD, MD  Chief Complaint  Patient presents with  . Code Stroke        Subjective:   April Conway today has, No headache, No chest pain, No abdominal pain - No Nausea, No new weakness tingling or numbness, No Cough - SOB.  Objective:   Filed Vitals:   08/23/11 0500 08/23/11 0501 08/23/11 0503 08/23/11 0506  BP: 131/65 130/65 136/69 152/78  Pulse: 68 72 84 84  Temp: 97.8 F (36.6 C)     TempSrc: Oral     Resp: 18     Height:      Weight:    66.4 kg (146 lb 6.2 oz)  SpO2:        Wt Readings from Last 3 Encounters:  08/23/11 66.4 kg (146 lb 6.2 oz)  06/15/11 71.215 kg (157 lb)  04/27/11 71.578 kg (157 lb 12.8 oz)     Intake/Output Summary (Last 24 hours) at 08/23/11 1331 Last data filed at 08/22/11 1900  Gross per 24 hour  Intake    240 ml  Output      0 ml  Net    240 ml    Exam Awake Alert, Oriented *3, No new F.N deficits, Normal affect [name].AT,PERRAL Supple Neck,No JVD, No cervical lymphadenopathy appriciated.  Symmetrical Chest wall movement, Good air movement bilaterally, CTAB RRR,No Gallops,Rubs or new Murmurs, No Parasternal Heave +ve B.Sounds, Abd Soft, Non tender, No organomegaly appriciated, No rebound -guarding or rigidity. No Cyanosis, Clubbing or edema, No new Rash or bruise      Data Review  CBC  Lab 08/23/11 0630 08/22/11 0857 08/22/11 0848  WBC 6.0 -- 8.0  HGB 12.6 15.0 14.2  HCT 37.8 44.0 41.4  PLT 245 -- 268  MCV 94.0 -- 94.1  MCH 31.3 -- 32.3  MCHC 33.3 -- 34.3  RDW 13.3 -- 13.1  LYMPHSABS -- -- 1.4  MONOABS -- -- 0.5  EOSABS -- -- 0.1  BASOSABS -- -- 0.0  BANDABS -- -- --    Chemistries   Lab 08/23/11 0630 08/22/11 0857 08/22/11 0848  NA 141 142 141  K 4.1 3.6 3.6  CL 103 103 102  CO2 26 -- 29  GLUCOSE 87 105* 102*  BUN 15 7 8   CREATININE 0.67 0.70 0.57  CALCIUM 9.7 -- 9.9  MG -- -- --  AST -- -- 18   ALT -- -- 12  ALKPHOS -- -- 91  BILITOT -- -- 0.5   ------------------------------------------------------------------------------------------------------------------ estimated creatinine clearance is 48.2 ml/min (by C-G formula based on Cr of 0.67). ------------------------------------------------------------------------------------------------------------------  Basename 08/23/11 0630  HGBA1C 5.8*   ------------------------------------------------------------------------------------------------------------------  Basename 08/23/11 0630  CHOL 175  HDL 77  LDLCALC 85  TRIG 66  CHOLHDL 2.3  LDLDIRECT --   ------------------------------------------------------------------------------------------------------------------  Basename 08/22/11 1633  TSH 1.470  T4TOTAL --  T3FREE --  THYROIDAB --   ------------------------------------------------------------------------------------------------------------------ No results found for this basename: VITAMINB12:2,FOLATE:2,FERRITIN:2,TIBC:2,IRON:2,RETICCTPCT:2 in the last 72 hours  Coagulation profile  Lab 08/22/11 1633 08/22/11 0848  INR 0.99 0.93  PROTIME -- --    No results found for this basename: DDIMER:2 in the last 72 hours  Cardiac Enzymes  Lab 08/22/11 0848  CKMB 2.4  TROPONINI <0.30  MYOGLOBIN --   ------------------------------------------------------------------------------------------------------------------ No components found with this basename: POCBNP:3  Micro Results No results found for this or any previous visit (from the past 240 hour(s)).  Radiology Reports Dg Chest 2 View  08/22/2011  *RADIOLOGY REPORT*  Clinical Data: Dizziness and borderline hypertension.  Stroke.  CHEST - 2 VIEW  Comparison: Report from chest radiograph of 2004 (images not available)  Findings: Heart size is normal.  Thoracic aorta is slightly tortuous and contains atherosclerotic calcification.  Pulmonary vascularity is normal.   Linear atelectasis or scarring is best visualized on the lateral view and is either within the lingula or the right middle lobe.  No airspace disease or pleural effusion is identified.  Bony mineralization appears decreased.  IMPRESSION: Linear atelectasis or scarring is seen anteriorly (lingula versus right middle lobe).  Original Report Authenticated By: Britta Mccreedy, M.D.   Ct Head Wo Contrast  08/22/2011  *RADIOLOGY REPORT*  Clinical Data: 79 year old female with dizziness.  CT HEAD WITHOUT CONTRAST  Technique:  Contiguous axial images were obtained from the base of the skull through the vertex without contrast.  Comparison: None  Findings: Bilateral temporal and parietal encephalomalacia changes are identified. Bilateral craniectomy defects are present.  Mild chronic small vessel white matter ischemic changes are noted.  No acute intracranial abnormalities are identified, including mass lesion or mass effect, hydrocephalus, extra-axial fluid collection, midline shift, hemorrhage, or acute infarction.  The visualized bony calvarium is unremarkable. A left mastoid effusion is noted.  IMPRESSION: No evidence of acute intracranial abnormality.  Encephalomalacia and chronic small vessel white matter ischemic changes.  Left mastoid effusion.  Original Report Authenticated By: Rosendo Gros, M.D.   Scheduled Meds:   . aspirin  300 mg Rectal Daily   Or  . aspirin  325 mg Oral Daily  . calcium carbonate  1 tablet Oral Daily  . heparin  5,000 Units Subcutaneous Q8H  . levETIRAcetam  500 mg Oral BID  . sodium chloride  3 mL Intravenous Q12H  . DISCONTD: calcium carbonate  600 mg Oral Daily   Continuous Infusions:  PRN Meds:.albuterol, guaiFENesin-dextromethorphan, HYDROcodone-acetaminophen, ondansetron (ZOFRAN) IV, ondansetron, senna-docusate  Assessment & Plan   1.Dizziness/syncope -  unclear etiology patient does have underlying history of seizures, CT brain unremarkable, stable bilateral carotid  duplex, A1c &  lipid panel unremarkable, no other focal neurological deficits, pending Keppra level, pending MRI MRA echogram and EEG, we'll continue aspirin and home dose statin. Will await EEG results neurology is following the patient. She will be seen by PT OT and speech also.   Lab Results  Component Value Date   HGBA1C 5.8* 08/23/2011    Lab Results  Component Value Date   CHOL 175 08/23/2011   HDL 77 08/23/2011   LDLCALC 85 08/23/2011   LDLDIRECT 139.9 08/12/2009   TRIG 66 08/23/2011   CHOLHDL 2.3 08/23/2011     2.History of traumatic brain injury and seizures - check Keppra level for now continue home dose, EEG as recommended by neurology .    3.History of hypertension and dyslipidemia - home dose statin will be continued, lipid panel will be checked, and appetite is will be held as TIA/CVA is in the differential at this time.   DVT Prophylaxis   Heparin       Leroy Sea M.D on 08/23/2011 at 1:31 PM  [facility] Office  [phone]
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
## 2011-08-23 NOTE — Telephone Encounter (Signed)
Reviewed note - pt subsequently seen in ER for same - thanks

## 2011-08-23 NOTE — Progress Notes (Signed)
   CARE MANAGEMENT NOTE 08/23/2011  Patient:  April Conway, April Conway   Account Number:  [id number]  Date Initiated:  08/23/2011  Documentation initiated by:  Onnie Boer  Subjective/Objective Assessment:   PT WAS ADMITTED WITH [facility]     Action/Plan:   PROGRESSION OF CARE AND DISCHARGE PLANNING   Anticipated DC Date:  08/24/2011   Anticipated DC Plan:  HOME W HOME HEALTH SERVICES      DC Planning Services  CM consult      Choice offered to / List presented to:  C-4 Adult Children           Status of service:  In process, will continue to follow Medicare Important Message given?   (If response is "NO", the following Medicare IM given date fields will be blank) Date Medicare IM given:   Date Additional Medicare IM given:    Discharge Disposition:    Per UR Regulation:    If discussed at Long Length of Stay Meetings, dates discussed:    Comments:  ZOX:[id number], VALERIE A  08/23/11 Onnie Boer, RN, BSN 1525 PT WAS ADMITTED WITH [facility].  PTA PT WAS AT HOME WITH SELF CARE.  IF ALL W/U IS OK, PT WILL BE DC'D TO HOME TOMORROW. HH PT WAS RECOMMENDED.  LIST OF AGENCIES WAS LEFT WITH PT AND HER DAUGHTERS.  WILL F/U TOMORROW.

## 2011-08-23 NOTE — Progress Notes (Signed)
*  PRELIMINARY RESULTS* Vascular Ultrasound Carotid Duplex (Doppler) has been completed.  Preliminary findings: Bilaterally no significant ICA stenosis with antegrade vertebral flow.  Farrel Demark, RDMS     08/23/2011, 10:58 AM

## 2011-08-23 NOTE — Progress Notes (Signed)
  Echocardiogram 2D Echocardiogram has been performed.  April Conway A 08/23/2011, 11:25 AM

## 2011-08-23 NOTE — Progress Notes (Signed)
Utilization review completed.  

## 2011-08-24 ENCOUNTER — Inpatient Hospital Stay (HOSPITAL_COMMUNITY): Payer: Medicare Other

## 2011-08-24 MED ORDER — AMLODIPINE BESYLATE 5 MG PO TABS
5.0000 mg | ORAL_TABLET | Freq: Every day | ORAL | Status: DC
  Administered 2011-08-24 – 2011-08-25 (×2): 5 mg via ORAL
  Filled 2011-08-24 (×2): qty 1

## 2011-08-24 NOTE — Progress Notes (Signed)
   CARE MANAGEMENT NOTE 08/24/2011  Patient:  April Conway, April Conway   Account Number:  [id number]  Date Initiated:  08/23/2011  Documentation initiated by:  Onnie Boer  Subjective/Objective Assessment:   PT WAS ADMITTED WITH [facility]     Action/Plan:   PROGRESSION OF CARE AND DISCHARGE PLANNING   Anticipated DC Date:  08/24/2011   Anticipated DC Plan:  HOME W HOME HEALTH SERVICES      DC Planning Services  CM consult      Choice offered to / List presented to:  C-4 Adult Children           Status of service:  In process, will continue to follow Medicare Important Message given?   (If response is "NO", the following Medicare IM given date fields will be blank) Date Medicare IM given:   Date Additional Medicare IM given:    Discharge Disposition:    Per UR Regulation:    If discussed at Long Length of Stay Meetings, dates discussed:    Comments:  ZOX:[id number], Raenette Rover  08/24/2011  [address] [place], [place]  [phone] Met with patient and daughters regarding discharge planning for home health PT services. They selected [facility] as home care agency. The patient stated she did not need any PT at home, she is very independent and is doing fine. CM explained the PT visit would evaluate if any safety issues, to make sure she is safe entering and exiting the home and inside the home. She declines any home health service. Received call from daughter Windell Moulding requesting the PT visit the home. CM explained it is patient's right to decline the PT services. CM attempted to revisit patient and if any change in decision for home PT. Nurse and OT in room with patient and family discussing the importance of home PT, patient continues to decline.   08/23/11 Onnie Boer, RN, BSN 1525 PT WAS ADMITTED WITH [facility].  PTA PT WAS AT HOME WITH SELF CARE.  IF ALL W/U IS OK, PT WILL BE DC'D TO HOME TOMORROW. HH PT WAS RECOMMENDED.  LIST OF AGENCIES WAS LEFT WITH PT AND HER DAUGHTERS.  WILL F/U  TOMORROW.

## 2011-08-24 NOTE — Progress Notes (Signed)
OT Note OT discussed DC plans with pt.  Pt appears to be irrational and have decreased safety awareness in regards to DC plans.  RN, CM and OT explained need for home therapy.  Pt eventually agreed. Will re attempt OT eval next day when pt less agitated. Lise Auer OT

## 2011-08-24 NOTE — Progress Notes (Signed)
April Conway [id number] is a 78 y.o. female,  Outpatient Primary MD for the patient is Rene Paci, MD, MD  Chief Complaint  Patient presents with  . Code Stroke        Subjective:   April Conway today has, No headache, No chest pain, No abdominal pain - No Nausea, No new weakness tingling or numbness, No Cough - SOB.  Objective:   Filed Vitals:   08/23/11 1428 08/23/11 2200 08/24/11 0600 08/24/11 1439  BP: 115/65 123/62 125/67 145/73  Pulse: 75 72 82 74  Temp: 97.1 F (36.2 C) 98 F (36.7 C) 97.9 F (36.6 C) 98.6 F (37 C)  TempSrc: Oral Oral Oral Oral  Resp: 20 18 16 20   Height:      Weight:   66.3 kg (146 lb 2.6 oz)   SpO2: 96% 96% 96% 97%    Wt Readings from Last 3 Encounters:  08/24/11 66.3 kg (146 lb 2.6 oz)  06/15/11 71.215 kg (157 lb)  04/27/11 71.578 kg (157 lb 12.8 oz)     Intake/Output Summary (Last 24 hours) at 08/24/11 1500 Last data filed at 08/24/11 0939  Gross per 24 hour  Intake      3 ml  Output      0 ml  Net      3 ml    Exam Awake Alert, Oriented *3, No new F.N deficits, Normal affect [place].AT,PERRAL Supple Neck,No JVD, No cervical lymphadenopathy appriciated.  Symmetrical Chest wall movement, Good air movement bilaterally, CTAB RRR,No Gallops,Rubs or new Murmurs, No Parasternal Heave +ve B.Sounds, Abd Soft, Non tender, No organomegaly appriciated, No rebound -guarding or rigidity. No Cyanosis, Clubbing or edema, No new Rash or bruise      Data Review  CBC  Lab 08/23/11 0630 08/22/11 0857 08/22/11 0848  WBC 6.0 -- 8.0  HGB 12.6 15.0 14.2  HCT 37.8 44.0 41.4  PLT 245 -- 268  MCV 94.0 -- 94.1  MCH 31.3 -- 32.3  MCHC 33.3 -- 34.3  RDW 13.3 -- 13.1  LYMPHSABS -- -- 1.4  MONOABS -- -- 0.5  EOSABS -- -- 0.1  BASOSABS -- -- 0.0  BANDABS -- -- --    Chemistries   Lab 08/23/11 0630 08/22/11 0857 08/22/11 0848  NA 141 142 141  K 4.1 3.6 3.6  CL 103 103 102  CO2 26 -- 29  GLUCOSE 87 105* 102*  BUN 15 7 8     CREATININE 0.67 0.70 0.57  CALCIUM 9.7 -- 9.9  MG -- -- --  AST -- -- 18  ALT -- -- 12  ALKPHOS -- -- 91  BILITOT -- -- 0.5   ------------------------------------------------------------------------------------------------------------------ estimated creatinine clearance is 48.2 ml/min (by C-G formula based on Cr of 0.67). ------------------------------------------------------------------------------------------------------------------  Basename 08/23/11 0630  HGBA1C 5.8*   ------------------------------------------------------------------------------------------------------------------  Basename 08/23/11 0630  CHOL 175  HDL 77  LDLCALC 85  TRIG 66  CHOLHDL 2.3  LDLDIRECT --   ------------------------------------------------------------------------------------------------------------------  Basename 08/22/11 1633  TSH 1.470  T4TOTAL --  T3FREE --  THYROIDAB --   ------------------------------------------------------------------------------------------------------------------ No results found for this basename: VITAMINB12:2,FOLATE:2,FERRITIN:2,TIBC:2,IRON:2,RETICCTPCT:2 in the last 72 hours  Coagulation profile  Lab 08/22/11 1633 08/22/11 0848  INR 0.99 0.93  PROTIME -- --    No results found for this basename: DDIMER:2 in the last 72 hours  Cardiac Enzymes  Lab 08/22/11 0848  CKMB 2.4  TROPONINI <0.30  MYOGLOBIN --   ------------------------------------------------------------------------------------------------------------------ No components found with this basename: POCBNP:3  Micro Results No  results found for this or any previous visit (from the past 240 hour(s)).  Radiology Reports Dg Chest 2 View  08/22/2011  *RADIOLOGY REPORT*  Clinical Data: Dizziness and borderline hypertension.  Stroke.  CHEST - 2 VIEW  Comparison: Report from chest radiograph of 2004 (images not available)  Findings: Heart size is normal.  Thoracic aorta is slightly tortuous  and contains atherosclerotic calcification.  Pulmonary vascularity is normal.  Linear atelectasis or scarring is best visualized on the lateral view and is either within the lingula or the right middle lobe.  No airspace disease or pleural effusion is identified.  Bony mineralization appears decreased.  IMPRESSION: Linear atelectasis or scarring is seen anteriorly (lingula versus right middle lobe).  Original Report Authenticated By: Britta Mccreedy, M.D.   Ct Head Wo Contrast  08/22/2011  *RADIOLOGY REPORT*  Clinical Data: 78 year old female with dizziness.  CT HEAD WITHOUT CONTRAST  Technique:  Contiguous axial images were obtained from the base of the skull through the vertex without contrast.  Comparison: None  Findings: Bilateral temporal and parietal encephalomalacia changes are identified. Bilateral craniectomy defects are present.  Mild chronic small vessel white matter ischemic changes are noted.  No acute intracranial abnormalities are identified, including mass lesion or mass effect, hydrocephalus, extra-axial fluid collection, midline shift, hemorrhage, or acute infarction.  The visualized bony calvarium is unremarkable. A left mastoid effusion is noted.  IMPRESSION: No evidence of acute intracranial abnormality.  Encephalomalacia and chronic small vessel white matter ischemic changes.  Left mastoid effusion.  Original Report Authenticated By: Rosendo Gros, M.D.   Scheduled Meds:    . aspirin  300 mg Rectal Daily   Or  . aspirin  325 mg Oral Daily  . calcium carbonate  1 tablet Oral Daily  . heparin  5,000 Units Subcutaneous Q8H  . levETIRAcetam  500 mg Oral BID  . sodium chloride  3 mL Intravenous Q12H   Continuous Infusions:  PRN Meds:.albuterol, guaiFENesin-dextromethorphan, HYDROcodone-acetaminophen, ondansetron (ZOFRAN) IV, ondansetron, senna-docusate  Assessment & Plan   1.Dizziness/syncope -  unclear etiology patient does have underlying history of seizures, CT brain  unremarkable, stable bilateral carotid duplex, A1c &  lipid panel unremarkable, no other focal neurological deficits, stable Keppra level, do to metal clip  MRI MRA cannot be done, & stable echogram which shows the ejection fraction of 65% without any focus of thrombus, EEG is pending, we'll continue aspirin and home dose statin. Will await EEG results neurology is following the patient. Discussed the case with neurologist Dr. Pearlean Brownie, who recommends that if EEG shows any focus of seizure to increase Keppra dose regardless if EEG is stable patient will be discharged with outpatient neurology follow.   Lab Results  Component Value Date   HGBA1C 5.8* 08/23/2011    Lab Results  Component Value Date   CHOL 175 08/23/2011   HDL 77 08/23/2011   LDLCALC 85 08/23/2011   LDLDIRECT 139.9 08/12/2009   TRIG 66 08/23/2011   CHOLHDL 2.3 08/23/2011      2.History of traumatic brain injury and seizures - check Keppra level for now continue home dose, EEG as recommended by neurology .      3.History of hypertension and dyslipidemia - home dose statin will be continued, continue home medications for blood pressure.  DVT Prophylaxis   Heparin       Leroy Sea M.D on 08/24/2011 at 3:00 PM  [facility] Office  [phone]

## 2011-08-24 NOTE — Consult Note (Signed)
EEG = normal awake and drowsy record.   Dacian Orrico, MD 

## 2011-08-24 NOTE — Progress Notes (Signed)
   CARE MANAGEMENT NOTE 08/24/2011  Patient:  DELTA, PICHON   Account Number:  [id number]  Date Initiated:  08/23/2011  Documentation initiated by:  Onnie Boer  Subjective/Objective Assessment:   PT WAS ADMITTED WITH [facility]     Action/Plan:   PROGRESSION OF CARE AND DISCHARGE PLANNING   Anticipated DC Date:  08/24/2011   Anticipated DC Plan:  HOME W HOME HEALTH SERVICES      DC Planning Services  CM consult      Choice offered to / List presented to:  C-4 Adult Children           Status of service:  In process, will continue to follow Medicare Important Message given?   (If response is "NO", the following Medicare IM given date fields will be blank) Date Medicare IM given:   Date Additional Medicare IM given:    Discharge Disposition:    Per UR Regulation:    If discussed at Long Length of Stay Meetings, dates discussed:    Comments:  EAV:[id number], Raenette Rover  08/24/2011 1530 Per daughter Windell Moulding, the patient has agreed to a PT evaluation. [facility] called with referral for home PT.  08/24/2011  [address] RN, [place]  [phone] Met with patient and daughters regarding discharge planning for home health PT services. They selected [facility] as home care agency. The patient stated she did not need any PT at home, she is very independent and is doing fine. CM explained the PT visit would evaluate if any safety issues, to make sure she is safe entering and exiting the home and inside the home. She declines any home health service. Received call from daughter Windell Moulding requesting the PT visit the home. CM explained it is patient's right to decline the PT services. CM attempted to revisit patient and if any change in decision for home PT. Nurse and OT in room with patient and family discussing the importance of home PT, patient continues to decline.   08/23/11 Onnie Boer, RN, BSN 1525 PT WAS ADMITTED WITH [facility].  PTA PT WAS AT HOME WITH SELF CARE.  IF ALL W/U IS OK, PT WILL  BE DC'D TO HOME TOMORROW. HH PT WAS RECOMMENDED.  LIST OF AGENCIES WAS LEFT WITH PT AND HER DAUGHTERS.  WILL F/U TOMORROW.

## 2011-08-24 NOTE — Evaluation (Signed)
Speech Language Pathology Evaluation Patient Details Name: April Conway MRN: [id number] DOB: [birth year] Today's Date: 08/24/2011  Problem List:  Patient Active Problem List  Diagnoses  . DYSLIPIDEMIA  . CONDUCTIVE HEARING LOSS BILATERAL  . HYPERTENSION  . GEN OSTEOARTHROSIS INVOLVING MULTIPLE SITES  . OSTEOPENIA  . SEIZURE DISORDER  . URINARY INCONTINENCE, MIXED  . Syncope  . TBI (traumatic brain injury)   Past Medical History:  Past Medical History  Diagnosis Date  . CONDUCTIVE HEARING LOSS BILATERAL   . OSTEOPENIA     DEXA 08/2005: -1.7 L spine  . URINARY INCONTINENCE, MIXED   . DYSLIPIDEMIA   . HYPERTENSION   . SEIZURE DISORDER 1965    bkthru 1999, then 02/2011 following dc of AEDs  . GEN OSTEOARTHROSIS INVOLVING MULTIPLE SITES    Past Surgical History:  Past Surgical History  Procedure Date  . Appendectomy 1940  . Abdominal hysterectomy 1991  . Tonsillectomy 1934  . Left wrist  06/2002    S/P ORIF-Gramig  . Cataract extraction   . Fracture surgery     SLP Assessment/Plan/Recommendation Assessment Clinical Impression Statement: Pt appears with functional cognitive-linguistic skills, able to verbalize medical events and medical management at home.  Note neurology signed off as pt without acute CVA.  Pt with h/o TBI but lives alone and manages her home independently.  Clear speech/language skills present.    Note PT is recommending home health, pt stated she does not desire home health as she is "independent".  Pt repeated a story to SLP that she had said earlier in the session, (before tech did vitals) and she states this happens frequently.  Pt does not need acute speech services as she is at her baseline.    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 Overall Cognitive Status: Appears within functional limits for tasks assessed Arousal/Alertness: Awake/alert Orientation Level: Oriented X4 Attention: Sustained Memory: Appears intact Awareness: Appears intact Problem Solving: Appears intact Executive Function:  (Did not test) Safety/Judgment: Appears intact Comments: Pt able to state how she manages her pills at home, uses pill container at home S-S, also addressed when she was weak and daughter came to her house without key-pt used furniture to aid walking to open door, cognition appears intact  Comprehension Auditory Comprehension Overall Auditory Comprehension: Appears within functional limits for tasks assessed Yes/No Questions: Not tested Conversation: Complex Visual Recognition/Discrimination Discrimination: Within Function Limits Reading Comprehension Reading Status: Within funtional limits (read calendar)  Expression Expression Primary Mode of Expression: Verbal Verbal Expression Overall Verbal Expression: Appears within functional limits for tasks assessed Initiation: No impairment Naming: No impairment Pragmatics: No impairment Other Verbal Expression Comments: pt is verbose with intact pragmatics, semantics and use  Oral/Motor Oral Motor/Sensory Function Overall Oral Motor/Sensory Function: Appears within functional limits for tasks assessed Motor Speech Overall Motor Speech: Appears within functional limits for tasks assessed Resonance: Hyponasality (pt reports is baseline, attributes to pollen) Motor Speech Errors: Not applicable   Donavan Burnet, MS [facility] SLP [phone]
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
## 2011-08-25 ENCOUNTER — Ambulatory Visit: Payer: Medicare Other | Admitting: Internal Medicine
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
## 2011-08-25 NOTE — Evaluation (Signed)
Occupational Therapy Evaluation Patient Details Name: April Conway MRN: [id number] DOB: [birth year] Today's Date: 08/25/2011  Problem List:  Patient Active Problem List  Diagnoses  . DYSLIPIDEMIA  . CONDUCTIVE HEARING LOSS BILATERAL  . HYPERTENSION  . GEN OSTEOARTHROSIS INVOLVING MULTIPLE SITES  . OSTEOPENIA  . SEIZURE DISORDER  . URINARY INCONTINENCE, MIXED  . Syncope  . TBI (traumatic brain injury)    Past Medical History:  Past Medical History  Diagnosis Date  . CONDUCTIVE HEARING LOSS BILATERAL   . OSTEOPENIA     DEXA 08/2005: -1.7 L spine  . URINARY INCONTINENCE, MIXED   . DYSLIPIDEMIA   . HYPERTENSION   . SEIZURE DISORDER 1965    bkthru 1999, then 02/2011 following dc of AEDs  . GEN OSTEOARTHROSIS INVOLVING MULTIPLE SITES    Past Surgical History:  Past Surgical History  Procedure Date  . Appendectomy 1940  . Abdominal hysterectomy 1991  . Tonsillectomy 1934  . Left wrist  06/2002    S/P ORIF-Gramig  . Cataract extraction   . Fracture surgery     OT Assessment/Plan/Recommendation OT Assessment Clinical Impression Statement: Pt admitted for pre-syncopal episode without LOC. Pt appears to be functioning at her baseline in ADL.  Extensive discussion about fall prevention at home and use of AE or DME or relying on family for assist for some household tasks.  Recommended medical alert.  No further acute needs. Recommend safety eval at home by [facility] if pt is agreeable.  OT Recommendation/Assessment: Patient does not need any further OT services OT Recommendation Follow Up Recommendations: Home health OT  OT Evaluation Precautions/Restrictions  Precautions Precautions: Fall Restrictions Weight Bearing Restrictions: No Prior Functioning Home Living Lives With: Alone Available Help at Discharge: Family;Available PRN/intermittently Type of Home: House Home Access: Stairs to enter [employer] of Steps: 1 - front(main level)  or level entry at back  (into basement) Home Layout: Two level Alternate Level Stairs-Number of Steps: does not have to use basement Bathroom Shower/Tub: Engineer, manufacturing systems: Standard Home Adaptive Equipment: Straight cane;Walker - rolling Prior Function Level of Independence: Independent with assistive device(s) Able to Take Stairs?: Yes Driving: Yes Vocation: Retired  ADL ADL Eating/Feeding: Independent;Performed Where Assessed - Eating/Feeding: Bed level Grooming: Performed;Modified independent;Wash/dry hands Where Assessed - Grooming: Sitting at sink Upper Body Bathing: Simulated;Set up Where Assessed - Upper Body Bathing: Sitting, bed Lower Body Bathing: Simulated;Set up Where Assessed - Lower Body Bathing: Sitting, bed;Sit to stand from bed Upper Body Dressing: Simulated;Set up Where Assessed - Upper Body Dressing: Sitting, bed Lower Body Dressing: Performed;Set up Where Assessed - Lower Body Dressing: Sitting, bed;Sit to stand from bed Toilet Transfer: Performed;Modified independent Toilet Transfer Method: Proofreader: Regular height toilet;Grab bars Toileting - Clothing Manipulation: Performed;Independent Where Assessed - Toileting Clothing Manipulation: Standing Toileting - Hygiene: Performed;Independent Where Assessed - Toileting Hygiene: Sit on 3-in-1 or toilet Equipment Used: Rolling walker Ambulation Related to ADLs: mod I with RW ADL Comments: Discussed with pt fall prevention with ADL/IADL at home. Vision/Perception  Vision - History Baseline Vision: Wears glasses all the time Patient Visual Report: No change from baseline Cognition Cognition Arousal/Alertness: Awake/alert Overall Cognitive Status: Appears within functional limits for tasks assessed Orientation Level: Oriented X4 Sensation/Coordination Sensation Light Touch: Appears Intact Hot/Cold: Appears Intact Proprioception: Appears Intact Coordination Gross Motor Movements are Fluid  and Coordinated: Yes Fine Motor Movements are Fluid and Coordinated: Yes Extremity Assessment RUE Assessment RUE Assessment: Within Functional Limits LUE Assessment LUE Assessment: Within Functional Limits  Mobility  Bed Mobility Bed Mobility: Yes Supine to Sit: 6: Modified independent (Device/Increase time) Sitting - Scoot to Edge of Bed: 6: Modified independent (Device/Increase time) Sit to Supine: 6: Modified independent (Device/Increase time) Transfers Transfers: Yes Sit to Stand: 6: Modified independent (Device/Increase time);With upper extremity assist;From bed;From toilet Stand to Sit: 6: Modified independent (Device/Increase time);To bed;To toilet End of Session OT - End of Session Equipment Utilized During Treatment: Gait belt Activity Tolerance: Patient tolerated treatment well Patient left: in bed;with bed alarm set;with call bell in reach General Behavior During Session: [facility] for tasks performed Cognition: [facility] for tasks performed   Evern Bio 08/25/2011, 9:36 AM  [phone]

## 2011-08-25 NOTE — Procedures (Signed)
EEG NUMBER:  [id number].  HISTORY:  An 78 years old woman referred for rule out seizures.  MEDICATIONS:  Keppra.  CONDITION OF RECORDING:  This 16-lead EEG was recorded with the patient in awake and drowsy states.  Background rhythms: background patterns in wakefulness were well organized with a well-sustained posterior dominant rhythm of 8.5-9 Hz, symmetrical and reactive to eye opening and closing. Drowsiness was associated with mild attenuation in voltage and slowing frequencies.  Abnormal potentials: no epileptiform activity or focal slowing was noted.  ACTIVATION PROCEDURES:  Hyperventilation was not performed.  Photic stimulation did not activate tracing.  EKG:  Single-channel of EKG monitoring was unremarkable.  IMPRESSION:  This was a normal awake and drowsy EEG.  A normal EEG does not rule out the clinical diagnosis of epilepsy. If clinically warranted, a repeat extended EEG or ambulatory recording may be obtained for prolonged recording times and sleep capture, which may increase the diagnostic yield.  Clinical correlation was suggested.          ______________________________ Carmell Austria, MD    IO:NGEX D:  08/24/2011 15:08:28  T:  08/24/2011 23:21:10  Job #:  [id number]

## 2011-08-25 NOTE — Discharge Instructions (Signed)
STROKE/TIA DISCHARGE INSTRUCTIONS SMOKING Cigarette smoking nearly doubles your risk of having a stroke & is the single most alterable risk factor  If you smoke or have smoked in the last 12 months, you are advised to quit smoking for your health.  Most of the excess cardiovascular risk related to smoking disappears within a year of stopping.  Ask you doctor about anti-smoking medications  [place] Quit Line: 1-800-QUIT NOW  Free Smoking Cessation Classes [phone]  CHOLESTEROL Know your levels; limit fat & cholesterol in your diet  Lipid Panel     Component Value Date/Time   CHOL 165 06/15/2011 0918   TRIG 33.0 06/15/2011 0918   HDL 69.70 06/15/2011 0918   CHOLHDL 2 06/15/2011 0918   VLDL 6.6 06/15/2011 0918   LDLCALC 89 06/15/2011 0918      Many patients benefit from treatment even if their cholesterol is at goal.  Goal: Total Cholesterol (CHOL) less than 160  Goal:  Triglycerides (TRIG) less than 150  Goal:  HDL greater than 40  Goal:  LDL (LDLCALC) less than 100   BLOOD PRESSURE American Stroke Association blood pressure target is less that 120/80 mm/Hg  Your discharge blood pressure is:  BP: 114/69 mmHg  Monitor your blood pressure  Limit your salt and alcohol intake  Many individuals will require more than one medication for high blood pressure  DIABETES (A1c is a blood sugar average for last 3 months) Goal HGBA1c is under 7% (HBGA1c is blood sugar average for last 3 months)  Diabetes:     Lab Results  Component Value Date   HGBA1C 6.0 08/05/2008     Your HGBA1c can be lowered with medications, healthy diet, and exercise.  Check your blood sugar as directed by your physician  Call your physician if you experience unexplained or low blood sugars.  PHYSICAL ACTIVITY/REHABILITATION Goal is 30 minutes at least 4 days per week      Activity decreases your risk of heart attack and stroke and makes your heart stronger.  It helps control your weight and blood pressure; helps  you relax and can improve your mood.  Participate in a regular exercise program.  Talk with your doctor about the best form of exercise for you (dancing, walking, swimming, cycling).  DIET/WEIGHT Goal is to maintain a healthy weight  Your discharge diet is:  *** liquids Your height is:  Height: 5\' 7"  (170.2 cm) Your current weight is: Weight: 65.409 kg (144 lb 3.2 oz) Your Body Mass Index (BMI) is:  BMI (Calculated): 22.6   Following the type of diet specifically designed for you will help prevent another stroke.  Your goal weight range is:  ***  Your goal Body Mass Index (BMI) is 19-24.  Healthy food habits can help reduce 3 risk factors for stroke:  High cholesterol, hypertension, and excess weight.  RESOURCES Stroke/Support Group:  Call [phone]  they meet the 3rd Sunday of the month on the Rehab Unit at [facility], [place] ( no meetings June, July & Aug).  STROKE EDUCATION PROVIDED/REVIEWED AND GIVEN TO PATIENT Stroke warning signs and symptoms How to activate emergency medical system (call 911). Medications prescribed at discharge. Need for follow-up after discharge. Personal risk factors for stroke. Pneumonia vaccine given:    Flu vaccine given:    My questions have been answered, the writing is legible, and I understand these instructions.  I will adhere to these goals & educational materials that have been provided to me after my discharge from  the hospital.    Dizziness Dizziness is a common problem. It is a feeling of unsteadiness or lightheadedness. You may feel like you are about to faint. Dizziness can lead to injury if you stumble or fall. A person of any age group can suffer from dizziness, but dizziness is more common in older adults. CAUSES  Dizziness can be caused by many different things, including:  Middle ear problems.   Standing for too long.   Infections.   An allergic reaction.   Aging.   An emotional response to something, such as the sight of  blood.   Side effects of medicines.   Fatigue.   Problems with circulation or blood pressure.   Excess use of alcohol, medicines, or illegal drug use.   Breathing too fast (hyperventilation).   An arrhythmia or problems with your heart rhythm.   Low red blood cell count (anemia).   Pregnancy.   Vomiting, diarrhea, fever, or other illnesses that cause dehydration.   Diseases or conditions such as Parkinson's disease, high blood pressure (hypertension), diabetes, and thyroid problems.   Exposure to extreme heat.  DIAGNOSIS  To find the cause of your dizziness, your caregiver may do a physical exam, lab tests, radiologic imaging scans, or an electrocardiography test (ECG).  TREATMENT  Treatment of dizziness depends on the cause of your symptoms and can vary greatly. HOME CARE INSTRUCTIONS   Drink enough fluids to keep your urine clear or pale yellow. This is especially important in very hot weather. In the elderly, it is also important in cold weather.   If your dizziness is caused by medicines, take them exactly as directed. When taking blood pressure medicines, it is especially important to get up slowly.   Rise slowly from chairs and steady yourself until you feel okay.   In the morning, first sit up on the side of the bed. When this seems okay, stand slowly while holding onto something until you know your balance is fine.   If you need to stand in one place for a long time, be sure to move your legs often. Tighten and relax the muscles in your legs while standing.   If dizziness continues to be a problem, have someone stay with you for a day or two. Do this until you feel you are well enough to stay alone. Have the person call your caregiver if he or she notices changes in you that are concerning.   Do not drive or use heavy machinery if you feel dizzy.  SEEK IMMEDIATE MEDICAL CARE IF:   Your dizziness or lightheadedness gets worse.   You feel nauseous or vomit.   You  develop problems with talking, walking, weakness, or using your arms, hands, or legs.   You are not thinking clearly or you have difficulty forming sentences. It may take a friend or family member to determine if your thinking is normal.   You develop chest pain, abdominal pain, shortness of breath, or sweating.   Your vision changes.   You notice any bleeding.   You have side effects from medicine that seems to be getting worse rather than better.  MAKE SURE YOU:   Understand these instructions.   Will watch your condition.   Will get help right away if you are not doing well or get worse.  Document Released: 10/20/2000 Document Revised: 04/15/2011 Document Reviewed: 11/13/2010 [facility] Patient Information 2012 [place], [place].

## 2011-08-25 NOTE — Progress Notes (Signed)
Physical Therapy Treatment Patient Details Name: April Conway MRN: [id number] DOB: [birth year] Today's Date: 08/25/2011  PT Assessment/Plan  PT - Assessment/Plan Comments on Treatment Session: Improved functional abilities - family feel that patient is at baseline. Although she has support and assistance at home she prefers to be independent. PT Plan: Discharge plan remains appropriate;Frequency remains appropriate PT Frequency: Min 3X/week Follow Up Recommendations: Home health PT;Supervision - Intermittent PT Goals  Acute Rehab PT Goals PT Goal: Sit to Stand - Progress: Met PT Goal: Stand to Sit - Progress: Met PT Goal: Ambulate - Progress: Progressing toward goal PT Goal: Up/Down Stairs - Progress: Met  PT Treatment Precautions/Restrictions  Precautions Precautions: Fall Restrictions Weight Bearing Restrictions: No Mobility (including Balance) Bed Mobility Bed Mobility: Yes Supine to Sit: 6: Modified independent (Device/Increase time) Sitting - Scoot to Edge of Bed: 6: Modified independent (Device/Increase time) Sit to Supine: 6: Modified independent (Device/Increase time) Transfers Sit to Stand: 6: Modified independent (Device/Increase time);From bed;With upper extremity assist Stand to Sit: 6: Modified independent (Device/Increase time);To bed;With upper extremity assist Ambulation/Gait Ambulation/Gait Assistance: 5: Supervision Ambulation/Gait Assistance Details (indicate cue type and reason): Improved safety with gait - speed 2.17 feet per second indicative of ability to walk neighborhood speeds and have decreased falls at discharge. Improved gait pattern Ambulation Distance (Feet): 250 Feet Assistive device: Rolling walker Gait Pattern: Step-through pattern;Decreased stride length Stairs: Yes Stairs Assistance: 6: Modified independent (Device/Increase time) Stair Management Technique: One rail Right;Step to pattern;Forwards (Also simulated grocery bag taking up  stairs) Number of Stairs: 10   Posture/Postural Control Postural Limitations: Able to stand unaided for 1 minute High Level Balance High Level Balance Activites: Side stepping;Direction changes;Turns;Head turns High Level Balance Comments: No imbalance at support of walker with these activities   End of Session PT - End of Session Equipment Utilized During Treatment: Gait belt Activity Tolerance: Patient tolerated treatment well Patient left: in bed;with call bell in reach;with family/visitor present;with bed alarm set General Behavior During Session: [facility] for tasks performed Cognition: [facility] for tasks performed  Edwyna Perfect, PT  Pager [phone]  08/25/2011, 10:12 AM

## 2011-09-01 ENCOUNTER — Encounter: Payer: Self-pay | Admitting: Internal Medicine

## 2011-09-01 ENCOUNTER — Ambulatory Visit (INDEPENDENT_AMBULATORY_CARE_PROVIDER_SITE_OTHER): Payer: Medicare Other | Admitting: Internal Medicine

## 2011-09-01 VITALS — BP 152/84 | HR 74 | Temp 97.7°F | Wt 149.0 lb

## 2011-09-01 DIAGNOSIS — I10 Essential (primary) hypertension: Secondary | ICD-10-CM

## 2011-09-01 DIAGNOSIS — R55 Syncope and collapse: Secondary | ICD-10-CM

## 2011-09-01 DIAGNOSIS — R569 Unspecified convulsions: Secondary | ICD-10-CM
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
## 2011-09-01 NOTE — Progress Notes (Signed)
Subjective:    Patient ID: April Conway, female    DOB: [birth year], 78 y.o.   MRN: [id number]  HPI  Here for hospital follow up - Lightheaded/dizzy episode with hospitalization 08/2011 - unremarkable neuro/cardiac workup  No recurrent symptoms   Also reviewed chronic medical issues: Seizure - stopped dilantin 12/2010 due to rash - recurrent events per dtr: "small unresponsive events" in 02/2011 same as in 1999 when previously stopped dilantin >>started Keppra and doing well, no other breakthru events  HTN -  stopped coreg 12/2010 due to ?SE - no edema, headache, or chest pain - Started amlodipine 02/2011 and doing well without adverse side effects   Osteopenia - pt declines calcium - WB activities ongoing   Past Medical History  Diagnosis Date  . CONDUCTIVE HEARING LOSS BILATERAL   . OSTEOPENIA     DEXA 08/2005: -1.7 L spine  . URINARY INCONTINENCE, MIXED   . DYSLIPIDEMIA   . HYPERTENSION   . SEIZURE DISORDER 1965    bkthru 1999, then 02/2011 following dc of AEDs  . GEN OSTEOARTHROSIS INVOLVING MULTIPLE SITES     Review of Systems  Constitutional: Negative for fever and unexpected weight change.  Cardiovascular: Negative for chest pain.  Neurological: Negative for seizures, syncope and headaches.       Objective:   Physical Exam  BP 152/84  Pulse 74  Temp(Src) 97.7 F (36.5 C) (Oral)  Wt 149 lb (67.586 kg)  SpO2 95% BP Readings from Last 3 Encounters:  09/01/11 152/84  08/25/11 114/69  06/15/11 134/72   Constitutional: She appears well-developed and well-nourished. No distress. Dtr at side HENT: Head: Normocephalic. Mouth/Throat: No oropharyngeal exudate or redness. No hearing on left side (chronic) Cardiovascular: Normal rate, regular rhythm and normal heart sounds. no rub or murmur heard. no BLE edema Pulmonary/Chest: Effort normal and breath sounds normal. No respiratory distress or wheeze.  Psychiatric: She has a normal mood and affect. Her behavior is  normal.   Very pleasant and bright  Neurology - CN2-12 symmetrically intact; MAE with good strength; cognition, balance and memory normal  Lab Results  Component Value Date   WBC 6.0 08/23/2011   HGB 12.6 08/23/2011   HCT 37.8 08/23/2011   PLT 245 08/23/2011   CHOL 175 08/23/2011   TRIG 66 08/23/2011   HDL 77 08/23/2011   LDLDIRECT 139.9 08/12/2009   ALT 12 08/22/2011   AST 18 08/22/2011   NA 141 08/23/2011   K 4.1 08/23/2011   CL 103 08/23/2011   CREATININE 0.67 08/23/2011   BUN 15 08/23/2011   CO2 26 08/23/2011   TSH 1.470 08/22/2011   INR 0.99 08/22/2011   HGBA1C 5.8* 08/23/2011   Pertinent studies during 08/2011 hospitaliztion:  Carotid Doppler 08/23/11 = no significant ICA stenosis with antegrade vertebral flow  2-D echocardiogram 08/23/11-EF 65-70% with grade 1 diastolic dysfunction.  CT head 08/22/11 = no evidence of acute intracranial abnormality  CT angiogram head 08/23/11 = right MCA irregularity stenosis and decreased M2 M3 branches probably sequelae remote trauma and encephalomalacia, mouth for age anterior circulation atherosclerosis, moderate to severe posterior circulation atherosclerosis, no acute intracranial abnormality bilateral posttraumatic and encephalomalacic changes.  EEG = normal awake and drowsy record 4/60     Assessment & Plan:   Dizzy/presyncope - hosp for same 08/2011 reviewed - no cardiac or neuro abnormality - no recurrence -reviewed same with family and pt today  Time spent with pt/family today 25 minutes, greater than 50% time spent counseling patient on  hospitalization for dizzy spell, seizure and hypertension + medication review. Also review of prior records   Also See problem list. Medications and labs reviewed today.

## 2011-09-01 NOTE — Assessment & Plan Note (Signed)
Off HCTZ since 05/2010 - self stopped Coreg 01/2011 Resumed tx with amlodipine 02/2011 - doing well The current medical regimen is generally effective;  continue present plan and medications.   BP Readings from Last 3 Encounters:  09/01/11 152/84  08/25/11 114/69  06/15/11 134/72

## 2011-09-01 NOTE — Patient Instructions (Signed)
It was good to see you today. We have reviewed your prior records including labs and tests today Medications reviewed, no changes at this time. Remember to take Keppra 2x/day   FALL PREVENTION  If you are unsteady on your feet, use a cane, walker, or walk with someone's help.     Remove loose rugs or electrical cords from your home.     Keep your home well lit at night. Use glasses if you need them.     Avoid icy streets and wet or waxed floors.     Hold the railing when using stairs.     Watch out for your pets.     Install grab bars in your bathroom.     Exercise. Physical activity, especially weight-bearing exercise, helps strengthen bones. Strength and balance exercise, such as tai chi, helps prevent falls.     Alcohol and some medicines can make you more likely to fall. Discuss alcohol use with your caregiver. Ask your caregiver if any of your medicines might increase your risk for falling. Ask if safer alternatives are available.  HOME CARE INSTRUCTIONS    Try to prevent and avoid falls.     To pick up objects, bend at the knees. Do not bend with your back.     Do not smoke. If you smoke, ask for help to stop.     Have adequate calcium and vitamin D in your diet. Talk with your caregiver about amounts.     Before exercising, ask your caregiver what exercises will be good for you.     Only take over-the-counter or prescription medicines for pain, discomfort, or fever as directed by your caregiver.  SEEK MEDICAL CARE IF:    You have had a fracture and your pain is not controlled.     You have had a fracture and you are not able to return to activities as expected.     You are reinjured.     You develop side effects from medicines, especially stomach pain or trouble swallowing.     You develop new, unexplained problems.  SEEK IMMEDIATE MEDICAL CARE IF:    You develop sudden, severe pain in your back.     You develop pain after an injury or fall.  Document  Released: 02/03/2005 Document Revised: 01/06/2011 Document Reviewed: 12/22/2006 [facility] Patient Information 2012 [place], [place].

## 2011-12-15 ENCOUNTER — Encounter: Payer: Self-pay | Admitting: Internal Medicine

## 2011-12-15 ENCOUNTER — Other Ambulatory Visit (INDEPENDENT_AMBULATORY_CARE_PROVIDER_SITE_OTHER): Payer: Medicare Other

## 2011-12-15 ENCOUNTER — Ambulatory Visit (INDEPENDENT_AMBULATORY_CARE_PROVIDER_SITE_OTHER): Payer: Medicare Other | Admitting: Internal Medicine

## 2011-12-15 VITALS — BP 142/60 | HR 83 | Temp 97.6°F | Ht 67.0 in | Wt 144.4 lb

## 2011-12-15 DIAGNOSIS — R569 Unspecified convulsions: Secondary | ICD-10-CM

## 2011-12-15 DIAGNOSIS — R300 Dysuria: Secondary | ICD-10-CM

## 2011-12-15 DIAGNOSIS — I10 Essential (primary) hypertension: Secondary | ICD-10-CM

## 2011-12-15 LAB — URINALYSIS, ROUTINE W REFLEX MICROSCOPIC
Hgb urine dipstick: NEGATIVE
Nitrite: NEGATIVE
Specific Gravity, Urine: >=1.03 (ref 1.000–1.030)
Urine Glucose: NEGATIVE
Urobilinogen, UA: 0.2 (ref 0.0–1.0)

## 2011-12-15 NOTE — Assessment & Plan Note (Signed)
Off HCTZ since 05/2010 - self stopped Coreg 01/2011 Resumed tx with amlodipine 02/2011 - doing well The current medical regimen is generally effective;  continue present plan and medications.   BP Readings from Last 3 Encounters:  12/15/11 142/60  09/01/11 152/84  08/25/11 114/69

## 2011-12-15 NOTE — Progress Notes (Signed)
Subjective:    Patient ID: April Conway, female    DOB: 03-31-1923, 78 y.o.   MRN: [id number]  HPI  Here for follow up - reviewed chronic medical issues:  Lightheaded/dizzy episode with hospitalization 08/2011 - unremarkable neuro/cardiac workup  No recurrent symptoms   Seizure - stopped dilantin 12/2010 due to rash - recurrent events per dtr: "small unresponsive events" in 02/2011 same as in 1999 when previously stopped dilantin >>started Keppra and doing well, no other breakthru events  hypertension - stopped coreg 12/2010 due to ?SE - no edema, headache, or chest pain - Started amlodipine 02/2011 and doing well without adverse side effects   Osteopenia - pt declines calcium - WB activities ongoing   Past Medical History  Diagnosis Date  . CONDUCTIVE HEARING LOSS BILATERAL   . OSTEOPENIA     DEXA 08/2005: -1.7 L spine  . URINARY INCONTINENCE, MIXED   . DYSLIPIDEMIA   . HYPERTENSION   . SEIZURE DISORDER 1965    bkthru 1999, then 02/2011 following dc of AEDs  . GEN OSTEOARTHROSIS INVOLVING MULTIPLE SITES     Review of Systems  Constitutional: Negative for fever, fatigue and unexpected weight change.  Cardiovascular: Negative for chest pain.  Genitourinary: Positive for urgency and frequency. Negative for difficulty urinating and pelvic pain.  Neurological: Negative for seizures, syncope and headaches.       Objective:   Physical Exam  BP 142/60  Pulse 83  Temp 97.6 F (36.4 C) (Oral)  Ht 5\' 7"  (1.702 m)  Wt 144 lb 6.4 oz (65.499 kg)  BMI 22.62 kg/m2  SpO2 96% BP Readings from Last 3 Encounters:  12/15/11 142/60  09/01/11 152/84  08/25/11 114/69   Constitutional: She appears well-developed and well-nourished. No distress. Dtr at side HENT: Head: Normocephalic. Mouth/Throat: No oropharyngeal exudate or redness. No hearing on left side (chronic) Cardiovascular: Normal rate, regular rhythm and normal heart sounds. no rub or murmur heard. no BLE  edema Pulmonary/Chest: Effort normal and breath sounds normal. No respiratory distress or wheeze.  Psychiatric: She has a normal mood and affect. Her behavior is normal.   Very pleasant and bright  Neurology - CN2-12 symmetrically intact; MAE with good strength; cognition, balance and memory normal  Lab Results  Component Value Date   WBC 6.0 08/23/2011   HGB 12.6 08/23/2011   HCT 37.8 08/23/2011   PLT 245 08/23/2011   CHOL 175 08/23/2011   TRIG 66 08/23/2011   HDL 77 08/23/2011   LDLDIRECT 139.9 08/12/2009   ALT 12 08/22/2011   AST 18 08/22/2011   NA 141 08/23/2011   K 4.1 08/23/2011   CL 103 08/23/2011   CREATININE 0.67 08/23/2011   BUN 15 08/23/2011   CO2 26 08/23/2011   TSH 1.470 08/22/2011   INR 0.99 08/22/2011   HGBA1C 5.8* 08/23/2011   Pertinent studies during 08/2011 hospitaliztion:  Carotid Doppler 08/23/11 = no significant ICA stenosis with antegrade vertebral flow  2-D echocardiogram 08/23/11-EF 65-70% with grade 1 diastolic dysfunction.  CT head 08/22/11 = no evidence of acute intracranial abnormality  CT angiogram head 08/23/11 = right MCA irregularity stenosis and decreased M2 M3 branches probably sequelae remote trauma and encephalomalacia, mouth for age anterior circulation atherosclerosis, moderate to severe posterior circulation atherosclerosis, no acute intracranial abnormality bilateral posttraumatic and encephalomalacic changes.  EEG = normal awake and drowsy record 4/60     Assessment & Plan:   Dysuria/frequency - new in last 10d - check UA and tx if +UTI  Dizzy/presyncope - hosp for same 08/2011 reviewed - no cardiac or neuro abnormality - no recurrence -reviewed same with family and pt today  Also see problem list. Medications and labs reviewed today.

## 2011-12-15 NOTE — Patient Instructions (Signed)
It was good to see you today. We have reviewed your interval records including labs and tests today Medications reviewed, no changes at this time. Test(s) ordered today. Your results will be called to you after review (48-72hours after test completion). If any changes need to be made, you will be notified at that time. Please schedule followup in 4-6 months, call sooner if problems.  FALL PREVENTION  If you are unsteady on your feet, use a cane, walker, or walk with someone's help.     Remove loose rugs or electrical cords from your home.     Keep your home well lit at night. Use glasses if you need them.     Avoid icy streets and wet or waxed floors.     Hold the railing when using stairs.     Watch out for your pets.     Install grab bars in your bathroom.     Exercise. Physical activity, especially weight-bearing exercise, helps strengthen bones. Strength and balance exercise, such as tai chi, helps prevent falls.     Alcohol and some medicines can make you more likely to fall. Discuss alcohol use with your caregiver. Ask your caregiver if any of your medicines might increase your risk for falling. Ask if safer alternatives are available.  HOME CARE INSTRUCTIONS    Try to prevent and avoid falls.     To pick up objects, bend at the knees. Do not bend with your back.     Do not smoke. If you smoke, ask for help to stop.     Have adequate calcium and vitamin D in your diet. Talk with your caregiver about amounts.     Before exercising, ask your caregiver what exercises will be good for you.     Only take over-the-counter or prescription medicines for pain, discomfort, or fever as directed by your caregiver.  SEEK MEDICAL CARE IF:    You have had a fracture and your pain is not controlled.     You have had a fracture and you are not able to return to activities as expected.     You are reinjured.     You develop side effects from medicines, especially stomach pain or  trouble swallowing.     You develop new, unexplained problems.  SEEK IMMEDIATE MEDICAL CARE IF:    You develop sudden, severe pain in your back.     You develop pain after an injury or fall.  Document Released: 02/03/2005 Document Revised: 01/06/2011 Document Reviewed: 12/22/2006 [facility] Patient Information 2012 [place], [place].
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
## 2012-01-21 ENCOUNTER — Encounter: Payer: Self-pay | Admitting: Internal Medicine

## 2012-01-21 ENCOUNTER — Other Ambulatory Visit: Payer: Medicare Other

## 2012-01-21 ENCOUNTER — Ambulatory Visit (INDEPENDENT_AMBULATORY_CARE_PROVIDER_SITE_OTHER): Payer: Medicare Other | Admitting: Internal Medicine

## 2012-01-21 VITALS — BP 140/70 | HR 68 | Temp 98.3°F | Resp 16 | Wt 142.0 lb

## 2012-01-21 DIAGNOSIS — N3946 Mixed incontinence: Secondary | ICD-10-CM

## 2012-01-21 DIAGNOSIS — R351 Nocturia: Secondary | ICD-10-CM

## 2012-01-21 DIAGNOSIS — R350 Frequency of micturition: Secondary | ICD-10-CM

## 2012-01-21 DIAGNOSIS — I10 Essential (primary) hypertension: Secondary | ICD-10-CM

## 2012-01-21 DIAGNOSIS — Z23 Encounter for immunization: Secondary | ICD-10-CM

## 2012-01-21 MED ORDER — CIPROFLOXACIN HCL 250 MG PO TABS: 250.0000 mg | ORAL_TABLET | Freq: Two times a day (BID) | ORAL | Status: DC

## 2012-01-21 MED ORDER — LEVETIRACETAM 500 MG PO TABS: 500.0000 mg | ORAL_TABLET | Freq: Two times a day (BID) | ORAL | Status: DC

## 2012-01-21 NOTE — Assessment & Plan Note (Signed)
Off HCTZ since 05/2010 - self stopped Coreg 01/2011 Resumed tx with amlodipine 02/2011 - doing well The current medical regimen is generally effective;  continue present plan and medications.   BP Readings from Last 3 Encounters:  01/21/12 140/70  12/15/11 142/60  09/01/11 152/84
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
## 2012-01-21 NOTE — Assessment & Plan Note (Signed)
Exacerbated by urinary tract infection, but chronic symptoms declines other med tx for same -  continue present plan and medications.  Reconsider need for bladder agent if unimproved after antibiotics

## 2012-01-21 NOTE — Progress Notes (Signed)
HPI: complains of UTI symptoms Onset several weeks ago, progressively worse associated with dysuria and small volume voiding with increased frequency at night denies hematuria, flank pain or fever The patient has a history of prior UTI  PMH: reviewed  ROS:  Gen.: No unexpected weight change, no night sweats Lungs: No cough or shortness of breath Cardiovascular: No palpitations or chest pain  PE: BP 140/70  Pulse 68  Temp 98.3 F (36.8 C) (Oral)  Resp 16  Wt 142 lb (64.411 kg) General: No acute distress - dtr at side Lungs: Clear to auscultation Cardiovascular: Regular rate rhythm, no edema Abdomen: Mild to moderate discomfort of her suprapubic region, no flank tenderness to palpation  Lab Results  Component Value Date   WBC 6.0 08/23/2011   HGB 12.6 08/23/2011   HCT 37.8 08/23/2011   PLT 245 08/23/2011   GLUCOSE 87 08/23/2011   CHOL 175 08/23/2011   TRIG 66 08/23/2011   HDL 77 08/23/2011   LDLDIRECT 139.9 08/12/2009   LDLCALC 85 08/23/2011   ALT 12 08/22/2011   AST 18 08/22/2011   NA 141 08/23/2011   K 4.1 08/23/2011   CL 103 08/23/2011   CREATININE 0.67 08/23/2011   BUN 15 08/23/2011   CO2 26 08/23/2011   TSH 1.470 08/22/2011   INR 0.99 08/22/2011   HGBA1C 5.8* 08/23/2011    Assessment/Plan: UTI, classic symptoms with history of same  Empiric antibiotic x7 days Urine culture for identification and sensitivities Hydration recommended education provided  Also flu shot today

## 2012-01-21 NOTE — Patient Instructions (Addendum)
It was good to see you today. Cipro antibiotics one pill twice a day for 5 days - Your prescription(s) have been submitted to your pharmacy. Please take as directed and contact our office if you believe you are having problem(s) with the medication(s). Test(s) ordered today. Your results will be called to you after review (48-72hours after test completion). If any changes need to be made, you will be notified at that time. Flu shot done today Other medications reviewed, refills provided as requested

## 2012-01-23 LAB — URINE CULTURE

## 2012-02-08 ENCOUNTER — Other Ambulatory Visit: Payer: Self-pay | Admitting: Internal Medicine

## 2012-02-08 NOTE — Telephone Encounter (Signed)
Caller: Angellica/Patient; Patient Name: April Conway; PCP: Rene Paci (Adults only); Best Callback Phone Number: [phone]; Call regarding: Urinary frequency; onset 02/06/12; was seen in the office 01/21/12 by Dr.Leschber for urinary frequency and dysuria; was given and completed a course of Cipro, which Damali says eliminated the sxs at the time; says the frequency has recurred, causing her to urinate frequently at night; says she urinated 3x before 0100 after going to bed last night; says she had her pharmacy send over a refill request; All emergent sxs of Urinary Sxs - Female protocol r/o except "evaluated by provider and no improvement in sxs after following treatment plan for the time specified by provider"; disposition see within 72hrs; offered to schedule appt, but Keyerra would first like to know if Dr.Leschber would call in the rx again; if not, PLEASE call back for an appt

## 2012-02-09 MED ORDER — CIPROFLOXACIN HCL 250 MG PO TABS: 250.0000 mg | ORAL_TABLET | Freq: Two times a day (BID) | ORAL | Status: AC

## 2012-02-09 NOTE — Telephone Encounter (Signed)
Pt advised of Rx/pharmacy 

## 2012-02-09 NOTE — Telephone Encounter (Signed)
Ok to refill cipro - erx done

## 2012-02-17 ENCOUNTER — Other Ambulatory Visit: Payer: Self-pay | Admitting: *Deleted

## 2012-02-17 MED ORDER — AMLODIPINE BESYLATE 5 MG PO TABS: 5.0000 mg | ORAL_TABLET | Freq: Every day | ORAL | Status: DC

## 2012-02-17 NOTE — Telephone Encounter (Signed)
R'cd fax from [facility] for refill of Amlodipine.

## 2012-04-10 ENCOUNTER — Encounter: Payer: Self-pay | Admitting: Internal Medicine

## 2012-04-10 ENCOUNTER — Ambulatory Visit (INDEPENDENT_AMBULATORY_CARE_PROVIDER_SITE_OTHER): Payer: Medicare Other | Admitting: Internal Medicine

## 2012-04-10 VITALS — BP 156/82 | HR 78 | Temp 96.6°F | Ht 67.0 in | Wt 142.8 lb

## 2012-04-10 DIAGNOSIS — R300 Dysuria: Secondary | ICD-10-CM

## 2012-04-10 DIAGNOSIS — R350 Frequency of micturition: Secondary | ICD-10-CM

## 2012-04-10 DIAGNOSIS — R3915 Urgency of urination: Secondary | ICD-10-CM

## 2012-04-10 DIAGNOSIS — N390 Urinary tract infection, site not specified: Secondary | ICD-10-CM

## 2012-04-10 MED ORDER — CIPROFLOXACIN HCL 500 MG PO TABS: 500.0000 mg | ORAL_TABLET | Freq: Two times a day (BID) | ORAL | Status: DC

## 2012-04-10 NOTE — Patient Instructions (Signed)
Urinary Tract Infection Urinary tract infections (UTIs) can develop anywhere along your urinary tract. Your urinary tract is your body's drainage system for removing wastes and extra water. Your urinary tract includes two kidneys, two ureters, a bladder, and a urethra. Your kidneys are a pair of bean-shaped organs. Each kidney is about the size of your fist. They are located below your ribs, one on each side of your spine. CAUSES Infections are caused by microbes, which are microscopic organisms, including fungi, viruses, and bacteria. These organisms are so small that they can only be seen through a microscope. Bacteria are the microbes that most commonly cause UTIs. SYMPTOMS  Symptoms of UTIs may vary by age and gender of the patient and by the location of the infection. Symptoms in young women typically include a frequent and intense urge to urinate and a painful, burning feeling in the bladder or urethra during urination. Older women and men are more likely to be tired, shaky, and weak and have muscle aches and abdominal pain. A fever may mean the infection is in your kidneys. Other symptoms of a kidney infection include pain in your back or sides below the ribs, nausea, and vomiting. DIAGNOSIS To diagnose a UTI, your caregiver will ask you about your symptoms. Your caregiver also will ask to provide a urine sample. The urine sample will be tested for bacteria and white blood cells. White blood cells are made by your body to help fight infection. TREATMENT  Typically, UTIs can be treated with medication. Because most UTIs are caused by a bacterial infection, they usually can be treated with the use of antibiotics. The choice of antibiotic and length of treatment depend on your symptoms and the type of bacteria causing your infection. HOME CARE INSTRUCTIONS  If you were prescribed antibiotics, take them exactly as your caregiver instructs you. Finish the medication even if you feel better after you  have only taken some of the medication.  Drink enough water and fluids to keep your urine clear or pale yellow.  Avoid caffeine, tea, and carbonated beverages. They tend to irritate your bladder.  Empty your bladder often. Avoid holding urine for long periods of time.  Empty your bladder before and after sexual intercourse.  After a bowel movement, women should cleanse from front to back. Use each tissue only once. SEEK MEDICAL CARE IF:   You have back pain.  You develop a fever.  Your symptoms do not begin to resolve within 3 days. SEEK IMMEDIATE MEDICAL CARE IF:   You have severe back pain or lower abdominal pain.  You develop chills.  You have nausea or vomiting.  You have continued burning or discomfort with urination. MAKE SURE YOU:   Understand these instructions.  Will watch your condition.  Will get help right away if you are not doing well or get worse. Document Released: 02/03/2005 Document Revised: 10/26/2011 Document Reviewed: 06/04/2011 [employer] Patient Information 2013 [employer].  

## 2012-04-10 NOTE — Progress Notes (Signed)
HPI  Pt presents to the clinic today with c/o urinary  Urgency, frequency and dysuria. This started 4 days ago. Pt had to get up 6 times last night to go to the restroom. The frequency is more of an annoyance than anything. She has not taken anything over the counter for the pain. She has tried to increase her fluid intake. She does have a history of recurrent UTI's, last one in 01/2012 treated with Cipro. She denies nausea, vomiting, fever, chills or back pain.   Review of Systems  Constitutional: Denies fever, malaise, fatigue, headache or abrupt weight changes.   GU: Pt reports urgency, frequency and pain with urination. Denies burning sensation, blood in urine, odor or discharge. Skin: Denies redness, rashes, lesions or ulcercations.   No other specific complaints in a complete review of systems (except as listed in HPI above).    Objective:   Physical Exam  General: Appears her stated age, well developed, well nourished in NAD. Cardiovascular: Normal rate and rhythm. S1,S2 noted.  No murmur, rubs or gallops noted. No JVD or BLE edema. No carotid bruits noted. Pulmonary/Chest: Normal effort and positive vesicular breath sounds. No respiratory distress. No wheezes, rales or ronchi noted.  Abdomen: Soft and nontender. Normal bowel sounds, no bruits noted. No distention or masses noted. Liver, spleen and kidneys non palpable. Tender to palpation over the bladder area. No CVA tenderness.      Assessment & Plan:   Urgency Frequency Dysuria  Urinalysis and urine culture- not able to obtain because patient could not give enough urine. eRx sent if for Cipro 500 mg BID x 5 days Drink plenty of fluids  Hypertension, new onset with additional workup required:  Pt states that her blood pressure has been elevated since she was a child, she refuses to take medications.  RTC as needed or if symptoms persist.

## 2012-04-26 ENCOUNTER — Encounter: Payer: Self-pay | Admitting: Internal Medicine

## 2012-04-26 ENCOUNTER — Other Ambulatory Visit (INDEPENDENT_AMBULATORY_CARE_PROVIDER_SITE_OTHER): Payer: Medicare Other

## 2012-04-26 ENCOUNTER — Ambulatory Visit (INDEPENDENT_AMBULATORY_CARE_PROVIDER_SITE_OTHER): Payer: Medicare Other | Admitting: Internal Medicine

## 2012-04-26 VITALS — BP 198/96 | HR 80 | Temp 97.2°F | Resp 16 | Wt 144.0 lb

## 2012-04-26 DIAGNOSIS — R300 Dysuria: Secondary | ICD-10-CM | POA: Insufficient documentation

## 2012-04-26 DIAGNOSIS — I10 Essential (primary) hypertension: Secondary | ICD-10-CM

## 2012-04-26 LAB — URINALYSIS, ROUTINE W REFLEX MICROSCOPIC
Bilirubin Urine: NEGATIVE
Nitrite: NEGATIVE
Specific Gravity, Urine: 1.02 (ref 1.000–1.030)
Total Protein, Urine: NEGATIVE
pH: 6 (ref 5.0–8.0)

## 2012-04-26 NOTE — Assessment & Plan Note (Signed)
I don't think antibiotics are indicated at this time but I will check her UA and urine clx and will treat if needed I think her s/s may be part of her demented/delusional state

## 2012-04-26 NOTE — Patient Instructions (Signed)
Urinary Tract Infection Urinary tract infections (UTIs) can develop anywhere along your urinary tract. Your urinary tract is your body's drainage system for removing wastes and extra water. Your urinary tract includes two kidneys, two ureters, a bladder, and a urethra. Your kidneys are a pair of bean-shaped organs. Each kidney is about the size of your fist. They are located below your ribs, one on each side of your spine. CAUSES Infections are caused by microbes, which are microscopic organisms, including fungi, viruses, and bacteria. These organisms are so small that they can only be seen through a microscope. Bacteria are the microbes that most commonly cause UTIs. SYMPTOMS  Symptoms of UTIs may vary by age and gender of the patient and by the location of the infection. Symptoms in young women typically include a frequent and intense urge to urinate and a painful, burning feeling in the bladder or urethra during urination. Older women and men are more likely to be tired, shaky, and weak and have muscle aches and abdominal pain. A fever may mean the infection is in your kidneys. Other symptoms of a kidney infection include pain in your back or sides below the ribs, nausea, and vomiting. DIAGNOSIS To diagnose a UTI, your caregiver will ask you about your symptoms. Your caregiver also will ask to provide a urine sample. The urine sample will be tested for bacteria and white blood cells. White blood cells are made by your body to help fight infection. TREATMENT  Typically, UTIs can be treated with medication. Because most UTIs are caused by a bacterial infection, they usually can be treated with the use of antibiotics. The choice of antibiotic and length of treatment depend on your symptoms and the type of bacteria causing your infection. HOME CARE INSTRUCTIONS  If you were prescribed antibiotics, take them exactly as your caregiver instructs you. Finish the medication even if you feel better after you  have only taken some of the medication.  Drink enough water and fluids to keep your urine clear or pale yellow.  Avoid caffeine, tea, and carbonated beverages. They tend to irritate your bladder.  Empty your bladder often. Avoid holding urine for long periods of time.  Empty your bladder before and after sexual intercourse.  After a bowel movement, women should cleanse from front to back. Use each tissue only once. SEEK MEDICAL CARE IF:   You have back pain.  You develop a fever.  Your symptoms do not begin to resolve within 3 days. SEEK IMMEDIATE MEDICAL CARE IF:   You have severe back pain or lower abdominal pain.  You develop chills.  You have nausea or vomiting.  You have continued burning or discomfort with urination. MAKE SURE YOU:   Understand these instructions.  Will watch your condition.  Will get help right away if you are not doing well or get worse. Document Released: 02/03/2005 Document Revised: 10/26/2011 Document Reviewed: 06/04/2011 [employer] Patient Information 2013 [employer].  

## 2012-04-26 NOTE — Progress Notes (Signed)
Subjective:    Patient ID: April Conway, female    DOB: 1923-04-24, 78 y.o.   MRN: [id number]  Dysuria  This is a recurrent problem. The current episode started 1 to 4 weeks ago. The problem occurs intermittently. The problem has been unchanged. The quality of the pain is described as burning. The pain is at a severity of 1/10. The patient is experiencing no pain. There has been no fever. The fever has been present for less than 1 day. She is not sexually active. There is no history of pyelonephritis. Associated symptoms include frequency. Pertinent negatives include no chills, discharge, flank pain, hematuria, hesitancy, nausea, sweats, urgency or vomiting. She has tried antibiotics for the symptoms. The treatment provided mild relief. Her past medical history is significant for recurrent UTIs. There is no history of catheterization, kidney stones, a single kidney, urinary stasis or a urological procedure.      Review of Systems  Constitutional: Negative for fever, chills, diaphoresis, activity change, appetite change, fatigue and unexpected weight change.  HENT: Negative.   Eyes: Negative.   Respiratory: Negative for cough, chest tightness, shortness of breath, wheezing and stridor.   Cardiovascular: Negative for chest pain, palpitations and leg swelling.  Gastrointestinal: Negative for nausea, vomiting, abdominal pain, diarrhea and constipation.  Genitourinary: Positive for dysuria and frequency. Negative for hesitancy, urgency, hematuria, flank pain, decreased urine volume, vaginal bleeding, vaginal discharge, enuresis, difficulty urinating, genital sores, vaginal pain, menstrual problem, pelvic pain and dyspareunia.  Musculoskeletal: Negative.   Neurological: Negative.   Hematological: Negative for adenopathy. Does not bruise/bleed easily.  Psychiatric/Behavioral: Negative.        Objective:   Physical Exam  Vitals reviewed. Constitutional: She is oriented to person, place, and  time. She appears well-developed and well-nourished.  Non-toxic appearance. She does not have a sickly appearance. She does not appear ill. No distress.  HENT:  Head: Normocephalic and atraumatic.  Mouth/Throat: Oropharynx is clear and moist. No oropharyngeal exudate.  Eyes: Conjunctivae normal are normal. Right eye exhibits no discharge. Left eye exhibits no discharge. No scleral icterus.  Neck: Normal range of motion. Neck supple. No JVD present. No tracheal deviation present. No thyromegaly present.  Cardiovascular: Normal rate, regular rhythm, normal heart sounds and intact distal pulses.  Exam reveals no gallop and no friction rub.   No murmur heard. Pulmonary/Chest: Effort normal and breath sounds normal. No stridor. No respiratory distress. She has no wheezes. She has no rales. She exhibits no tenderness.  Abdominal: Soft. Normal appearance and bowel sounds are normal. She exhibits no shifting dullness, no distension, no pulsatile liver, no fluid wave, no abdominal bruit, no ascites, no pulsatile midline mass and no mass. There is no hepatosplenomegaly, splenomegaly or hepatomegaly. There is no tenderness. There is no rigidity, no rebound, no guarding, no CVA tenderness, no tenderness at McBurney's point and negative Murphy's sign. No hernia. Hernia confirmed negative in the ventral area.  Musculoskeletal: Normal range of motion. She exhibits no edema and no tenderness.  Lymphadenopathy:    She has no cervical adenopathy.  Neurological: She is oriented to person, place, and time.  Skin: Skin is warm and dry. No rash noted. She is not diaphoretic. No erythema. No pallor.  Psychiatric: Judgment and thought content normal. Her mood appears anxious. Her affect is not angry, not blunt, not labile and not inappropriate. Her speech is tangential. Her speech is not rapid and/or pressured, not delayed and not slurred. She is not agitated, not aggressive, is not hyperactive,  not slowed, not withdrawn,  not actively hallucinating and not combative. Thought content is not paranoid and not delusional. Cognition and memory are impaired. She does not express impulsivity or inappropriate judgment. She does not exhibit a depressed mood. She expresses no homicidal and no suicidal ideation. She expresses no suicidal plans and no homicidal plans. She is communicative. She exhibits abnormal recent memory and abnormal remote memory. She is inattentive.     Lab Results  Component Value Date   WBC 6.0 08/23/2011   HGB 12.6 08/23/2011   HCT 37.8 08/23/2011   PLT 245 08/23/2011   GLUCOSE 87 08/23/2011   CHOL 175 08/23/2011   TRIG 66 08/23/2011   HDL 77 08/23/2011   LDLDIRECT 139.9 08/12/2009   LDLCALC 85 08/23/2011   ALT 12 08/22/2011   AST 18 08/22/2011   NA 141 08/23/2011   K 4.1 08/23/2011   CL 103 08/23/2011   CREATININE 0.67 08/23/2011   BUN 15 08/23/2011   CO2 26 08/23/2011   TSH 1.470 08/22/2011   INR 0.99 08/22/2011   HGBA1C 5.8* 08/23/2011       Assessment & Plan:

## 2012-04-26 NOTE — Assessment & Plan Note (Signed)
She refuses to take meds for HTN and her family is not willing to force her to take them

## 2012-04-28 LAB — CULTURE, URINE COMPREHENSIVE: Colony Count: NO GROWTH

## 2012-05-17 ENCOUNTER — Encounter: Payer: Self-pay | Admitting: Internal Medicine

## 2012-05-17 ENCOUNTER — Ambulatory Visit (INDEPENDENT_AMBULATORY_CARE_PROVIDER_SITE_OTHER): Payer: 59 | Admitting: Internal Medicine

## 2012-05-17 VITALS — BP 142/92 | HR 87 | Temp 97.6°F | Ht 67.0 in | Wt 142.0 lb

## 2012-05-17 DIAGNOSIS — I10 Essential (primary) hypertension: Secondary | ICD-10-CM

## 2012-05-17 DIAGNOSIS — R569 Unspecified convulsions: Secondary | ICD-10-CM

## 2012-05-17 NOTE — Progress Notes (Signed)
Subjective:    Patient ID: April Conway, female    DOB: February 09, 1923, 79 y.o.   MRN: [id number]  HPI  Pt seen today for follow-up of hypertension, UTI symptoms and seizure disorder.  Pt has no complaints today other than "old age aches and pains".  HTN:  Blood pressure today 142/92 down from last visit.  Pt denies headache, edema of feet and ankles, and chest pain.  Pt not currently taking medication for hypertension.  BP Readings from Last 3 Encounters:  05/17/12 142/92  04/26/12 198/96  04/10/12 156/82    UTI: Pt presented 3 weeks ago with burning and frequency.  Abx prescribed at that time.  Abx completed. Pt reports resolution of these symptoms and denies any current pain, burning, frequency with urination.  Seizure Disorder:  Daughter(here today) reports occasional witnessed seizure activity every 3-4 months.  Pt prescribed Keppra BID previously and pt reports taking only 1 tablet daily due to excessive drowsiness.   Past Medical History  Diagnosis Date  . CONDUCTIVE HEARING LOSS BILATERAL   . OSTEOPENIA     DEXA 08/2005: -1.7 L spine  . URINARY INCONTINENCE, MIXED   . DYSLIPIDEMIA   . HYPERTENSION   . SEIZURE DISORDER 1965    bkthru 1999, then 02/2011 following dc of AEDs  . GEN OSTEOARTHROSIS INVOLVING MULTIPLE SITES    Family History  Problem Relation Age of Onset  . Diabetes Father    History   Social History  . Marital Status: Widowed    Spouse Name: N/A    Number of Children: N/A  . Years of Education: N/A   Occupational History  . Not on file.   Social History Main Topics  . Smoking status: Never Smoker   . Smokeless tobacco: Never Used     Comment: Widow/widower. 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 One daughter nearby calls every night, other dgt in [place] area  . Alcohol Use: No  . Drug Use: No  . Sexually Active: No   Other Topics Concern  . Not on file   Social History Narrative  . No narrative on file     Review of Systems Pt denies fever, weight  changes, fatigue, headache, chest pain, edema of feet and ankles. Also denies pain, burning and frequency on urination.      Objective:   Physical Exam  Constitutional: She is oriented to person, place, and time. She appears well-developed and well-nourished. She is active. No distress.  Cardiovascular: Normal rate, regular rhythm and normal heart sounds.   Pulmonary/Chest: Effort normal and breath sounds normal.  Neurological: She is alert and oriented to person, place, and time.  Skin: Skin is warm and dry.  Psychiatric: She has a normal mood and affect. Thought content normal.   BP 142/92  Pulse 87  Temp 97.6 F (36.4 C) (Oral)  Ht 5\' 7"  (1.702 m)  Wt 142 lb (64.411 kg)  BMI 22.24 kg/m2  SpO2 94%      Assessment & Plan:   HTN:  Discussed with pt her blood pressure related to her age we have decided to forgo medication to treat her hypertension currently. She monitors blood pressure at home and we will continue to monitor at office visits as well.  UTI:  Resolved.  Pt completed abx and c/o no current symptoms.  Seizure Disorder:  She has agreed to take her keppra as prescribed (2 tablets daily).  We discussed timing, that she could try taking both tablets at bedtime so as to reduce daytime  sleepiness.   I have personally reviewed this case with PA student. I also personally examined this patient. I agree with history and findings as documented above. I reviewed, discussed and approve of the assessment and plan as listed above. Rene Paci, MD

## 2012-05-17 NOTE — Assessment & Plan Note (Signed)
Off HCTZ since 05/2010 - self stopped Coreg 01/2011 Resumed tx with amlodipine 02/2011 - but stopped same due to side effects  Off meds - discussed risk of tx/no tx and will continue without med tx for now and watch  BP Readings from Last 3 Encounters:  05/17/12 142/92  04/26/12 198/96  04/10/12 156/82

## 2012-05-17 NOTE — Patient Instructions (Signed)
It was good to see you today. We have reviewed your interval records including labs and tests today Medications reviewed, no changes at this time. No blood pressure medications and seizure medication 2x/day Please schedule followup in 4-6 months, call sooner if problems.  FALL PREVENTION  If you are unsteady on your feet, use a cane, walker, or walk with someone's help.     Remove loose rugs or electrical cords from your home.     Keep your home well lit at night. Use glasses if you need them.     Avoid icy streets and wet or waxed floors.     Hold the railing when using stairs.     Watch out for your pets.     Install grab bars in your bathroom.     Exercise. Physical activity, especially weight-bearing exercise, helps strengthen bones. Strength and balance exercise, such as tai chi, helps prevent falls.     Alcohol and some medicines can make you more likely to fall. Discuss alcohol use with your caregiver. Ask your caregiver if any of your medicines might increase your risk for falling. Ask if safer alternatives are available.  HOME CARE INSTRUCTIONS    Try to prevent and avoid falls.     To pick up objects, bend at the knees. Do not bend with your back.     Do not smoke. If you smoke, ask for help to stop.     Have adequate calcium and vitamin D in your diet. Talk with your caregiver about amounts.     Before exercising, ask your caregiver what exercises will be good for you.     Only take over-the-counter or prescription medicines for pain, discomfort, or fever as directed by your caregiver.  SEEK MEDICAL CARE IF:    You have had a fracture and your pain is not controlled.     You have had a fracture and you are not able to return to activities as expected.     You are reinjured.     You develop side effects from medicines, especially stomach pain or trouble swallowing.     You develop new, unexplained problems.  SEEK IMMEDIATE MEDICAL CARE IF:    You develop  sudden, severe pain in your back.     You develop pain after an injury or fall.  Document Released: 02/03/2005 Document Revised: 01/06/2011 Document Reviewed: 12/22/2006 [facility] Patient Information 2012 [place], [place].
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
## 2012-05-17 NOTE — Progress Notes (Signed)
Subjective:    Patient ID: April Conway, female    DOB: 04-Mar-1923, 79 y.o.   MRN: [id number]  HPI Here for follow up - reviewed chronic medical issues:  Lightheaded/dizzy episode with hospitalization 08/2011 - unremarkable neuro/cardiac workup  No recurrent symptoms   Seizure - stopped dilantin 12/2010 due to rash - recurrent events per dtr: "small unresponsive events" in 02/2011 same as in 1999 when previously stopped dilantin >>started Keppra and doing well, denies other breakthru events, but family less certain  hypertension - stopped coreg 12/2010 due to ?side effects - no edema, headache, or chest pain - Started amlodipine 02/2011, but not taking due to side effects   Osteopenia - pt declines calcium - WB activities ongoing   Past Medical History  Diagnosis Date  . CONDUCTIVE HEARING LOSS BILATERAL   . OSTEOPENIA     DEXA 08/2005: -1.7 L spine  . URINARY INCONTINENCE, MIXED   . DYSLIPIDEMIA   . HYPERTENSION   . SEIZURE DISORDER 1965    bkthru 1999, then 02/2011 following dc of AEDs  . GEN OSTEOARTHROSIS INVOLVING MULTIPLE SITES     Review of Systems  Constitutional: Negative for fever, fatigue and unexpected weight change.  Genitourinary: Negative for urgency, frequency, difficulty urinating and pelvic pain.  Neurological: Negative for seizures and syncope.       Objective:   Physical Exam  BP 142/92  Pulse 87  Temp 97.6 F (36.4 C) (Oral)  Ht 5\' 7"  (1.702 m)  Wt 142 lb (64.411 kg)  BMI 22.24 kg/m2  SpO2 94% BP Readings from Last 3 Encounters:  05/17/12 142/92  04/26/12 198/96  04/10/12 156/82   Constitutional: She appears well-developed and well-nourished. No distress. Dtr at side HENT: Head: Normocephalic. Mouth/Throat: No oropharyngeal exudate or redness. No hearing on left side (chronic) Cardiovascular: Normal rate, regular rhythm and normal heart sounds. no rub or murmur heard. no BLE edema Pulmonary/Chest: Effort normal and breath sounds normal. No  respiratory distress or wheeze.  Psychiatric: She has a normal mood and affect. Her behavior is normal.   Very pleasant and bright  Neurology - CN2-12 symmetrically intact; MAE with good strength; cognition, balance and memory normal  Lab Results  Component Value Date   WBC 6.0 08/23/2011   HGB 12.6 08/23/2011   HCT 37.8 08/23/2011   PLT 245 08/23/2011   CHOL 175 08/23/2011   TRIG 66 08/23/2011   HDL 77 08/23/2011   LDLDIRECT 139.9 08/12/2009   ALT 12 08/22/2011   AST 18 08/22/2011   NA 141 08/23/2011   K 4.1 08/23/2011   CL 103 08/23/2011   CREATININE 0.67 08/23/2011   BUN 15 08/23/2011   CO2 26 08/23/2011   TSH 1.470 08/22/2011   INR 0.99 08/22/2011   HGBA1C 5.8* 08/23/2011   Pertinent studies during 08/2011 hospitaliztion:  Carotid Doppler 08/23/11 = no significant ICA stenosis with antegrade vertebral flow  2-D echocardiogram 08/23/11-EF 65-70% with grade 1 diastolic dysfunction.  CT head 08/22/11 = no evidence of acute intracranial abnormality  CT angiogram head 08/23/11 = right MCA irregularity stenosis and decreased M2 M3 branches probably sequelae remote trauma and encephalomalacia, mouth for age anterior circulation atherosclerosis, moderate to severe posterior circulation atherosclerosis, no acute intracranial abnormality bilateral posttraumatic and encephalomalacic changes.  EEG = normal awake and drowsy record 4/60     Assessment & Plan:   see problem list. Medications and labs reviewed today.  Time spent with pt/family today 25 minutes, greater than 50% time spent counseling  patient on seizure, blood pressure and medication review. Also review of prior records

## 2012-06-16 ENCOUNTER — Other Ambulatory Visit: Payer: Self-pay | Admitting: *Deleted

## 2012-06-16 MED ORDER — LEVETIRACETAM 500 MG PO TABS: 500.0000 mg | ORAL_TABLET | Freq: Two times a day (BID) | ORAL | Status: DC

## 2012-10-30 ENCOUNTER — Other Ambulatory Visit (INDEPENDENT_AMBULATORY_CARE_PROVIDER_SITE_OTHER): Payer: 59

## 2012-10-30 ENCOUNTER — Encounter: Payer: Self-pay | Admitting: Internal Medicine

## 2012-10-30 ENCOUNTER — Ambulatory Visit (INDEPENDENT_AMBULATORY_CARE_PROVIDER_SITE_OTHER): Payer: 59 | Admitting: Internal Medicine

## 2012-10-30 VITALS — BP 142/86 | HR 76 | Temp 97.0°F | Wt 135.0 lb

## 2012-10-30 DIAGNOSIS — R634 Abnormal weight loss: Secondary | ICD-10-CM

## 2012-10-30 DIAGNOSIS — I10 Essential (primary) hypertension: Secondary | ICD-10-CM

## 2012-10-30 DIAGNOSIS — R7989 Other specified abnormal findings of blood chemistry: Secondary | ICD-10-CM

## 2012-10-30 LAB — CBC WITH DIFFERENTIAL/PLATELET
Basophils Relative: 0.4 % (ref 0.0–3.0)
Eosinophils Relative: 0.8 % (ref 0.0–5.0)
HCT: 41.2 % (ref 36.0–46.0)
Hemoglobin: 13.8 g/dL (ref 12.0–15.0)
Lymphs Abs: 1.4 10*3/uL (ref 0.7–4.0)
MCV: 95.7 fl (ref 78.0–100.0)
Monocytes Absolute: 0.5 10*3/uL (ref 0.1–1.0)
Neutro Abs: 5.1 10*3/uL (ref 1.4–7.7)
Platelets: 245 10*3/uL (ref 150.0–400.0)
WBC: 7 10*3/uL (ref 4.5–10.5)

## 2012-10-30 LAB — HEPATIC FUNCTION PANEL
Bilirubin, Direct: 0.1 mg/dL (ref 0.0–0.3)
Total Bilirubin: 0.9 mg/dL (ref 0.3–1.2)
Total Protein: 7.9 g/dL (ref 6.0–8.3)

## 2012-10-30 LAB — BASIC METABOLIC PANEL
BUN: 16 mg/dL (ref 6–23)
Chloride: 100 mEq/L (ref 96–112)
Glucose, Bld: 104 mg/dL — ABNORMAL HIGH (ref 70–99)
Potassium: 4.8 mEq/L (ref 3.5–5.1)
Sodium: 138 mEq/L (ref 135–145)

## 2012-10-30 LAB — TSH: TSH: 1.76 u[IU]/mL (ref 0.35–5.50)

## 2012-10-30 NOTE — Progress Notes (Signed)
  Subjective:    Patient ID: April Conway, female    DOB: 1922/12/17, 79 y.o.   MRN: [id number]  HPI  Here for follow up - reviewed chronic medical issues and interval medical issues  Past Medical History  Diagnosis Date  . CONDUCTIVE HEARING LOSS BILATERAL   . OSTEOPENIA     DEXA 08/2005: -1.7 L spine  . URINARY INCONTINENCE, MIXED   . DYSLIPIDEMIA   . HYPERTENSION   . SEIZURE DISORDER 1965    bkthru 1999, then 02/2011 following dc of AEDs  . GEN OSTEOARTHROSIS INVOLVING MULTIPLE SITES     Review of Systems  Constitutional: Positive for unexpected weight change. Negative for fever and fatigue.  Genitourinary: Negative for urgency, frequency, difficulty urinating and pelvic pain.  Neurological: Negative for seizures and syncope.       Objective:   Physical Exam  BP 142/86  Pulse 76  Temp(Src) 97 F (36.1 C) (Oral)  Wt 135 lb (61.236 kg)  BMI 21.14 kg/m2  SpO2 97% BP Readings from Last 3 Encounters:  10/30/12 142/86  05/17/12 142/92  04/26/12 198/96   Constitutional: She appears well-developed and well-nourished. No distress. Dtr at side HENT: Head: Normocephalic. Mouth/Throat: No oropharyngeal exudate or redness. No hearing on left side (chronic) Cardiovascular: Normal rate, regular rhythm and normal heart sounds. no rub or murmur heard. no BLE edema Pulmonary/Chest: Effort normal and breath sounds normal. No respiratory distress or wheeze.  Psychiatric: She has a normal mood and affect. Her behavior is normal.   Very pleasant and bright  Neurology - CN2-12 symmetrically intact; MAE with good strength; cognition, balance and memory normal  Lab Results  Component Value Date   WBC 6.0 08/23/2011   HGB 12.6 08/23/2011   HCT 37.8 08/23/2011   PLT 245 08/23/2011   CHOL 175 08/23/2011   TRIG 66 08/23/2011   HDL 77 08/23/2011   LDLDIRECT 139.9 08/12/2009   ALT 12 08/22/2011   AST 18 08/22/2011   NA 141 08/23/2011   K 4.1 08/23/2011   CL 103 08/23/2011   CREATININE 0.67  08/23/2011   BUN 15 08/23/2011   CO2 26 08/23/2011   TSH 1.470 08/22/2011   INR 0.99 08/22/2011   HGBA1C 5.8* 08/23/2011   Pertinent studies during 08/2011 hospitaliztion:  Carotid Doppler 08/23/11 = no significant ICA stenosis with antegrade vertebral flow  2-D echocardiogram 08/23/11-EF 65-70% with grade 1 diastolic dysfunction.  CT head 08/22/11 = no evidence of acute intracranial abnormality  CT angiogram head 08/23/11 = right MCA irregularity stenosis and decreased M2 M3 branches probably sequelae remote trauma and encephalomalacia, mouth for age anterior circulation atherosclerosis, moderate to severe posterior circulation atherosclerosis, no acute intracranial abnormality bilateral posttraumatic and encephalomalacic changes.  EEG = normal awake and drowsy record 4/60     Assessment & Plan:   see problem list. Medications and labs reviewed today.  Unintentional weight loss - 7# since last visit, no change in appetite or localizing GI symptoms - check screening labs including a1c - mild elevation 08/2011 reviewed but no hx glucose intolerance

## 2012-10-30 NOTE — Assessment & Plan Note (Signed)
Off HCTZ since 05/2010 - indep stopped Coreg 01/2011 Resumed tx with amlodipine 02/2011 - but stopped same due to side effects  remains off all meds - discussed risk of tx/no tx and will continue without med tx for now and watch  BP Readings from Last 3 Encounters:  10/30/12 142/86  05/17/12 142/92  04/26/12 198/96

## 2012-10-30 NOTE — Patient Instructions (Signed)
It was good to see you today. We have reviewed your interval records including labs and tests today Medications reviewed and updated, no changes recommended at this time. Test(s) ordered today. Your results will be released to MyChart (or called to you) after review, usually within 72hours after test completion. If any changes need to be made, you will be notified at that same time. Please schedule followup in 6 months for blood pressure and weight check, call sooner if problems.

## 2013-01-16 ENCOUNTER — Ambulatory Visit (INDEPENDENT_AMBULATORY_CARE_PROVIDER_SITE_OTHER): Payer: 59

## 2013-01-16 DIAGNOSIS — Z23 Encounter for immunization: Secondary | ICD-10-CM

## 2013-04-02 ENCOUNTER — Ambulatory Visit (INDEPENDENT_AMBULATORY_CARE_PROVIDER_SITE_OTHER): Payer: 59 | Admitting: Podiatry

## 2013-04-02 ENCOUNTER — Encounter: Payer: Self-pay | Admitting: Podiatry

## 2013-04-02 VITALS — BP 141/77 | HR 79 | Resp 18

## 2013-04-02 DIAGNOSIS — B351 Tinea unguium: Secondary | ICD-10-CM

## 2013-04-02 DIAGNOSIS — M79609 Pain in unspecified limb: Secondary | ICD-10-CM

## 2013-04-02 NOTE — Progress Notes (Signed)
  Subjective:    Patient ID: April Conway, female    DOB: Jan 13, 1923, 79 y.o.   MRN: [id number]  HPI trim my nails This patient presents for ongoing debridement for symptomatic onychomycoses at approximately three-month intervals. She's been  a patient in this practice since approximately 2011 for this service.   Review of Systems     Objective:   Physical Exam  Orientated x74 79 year old white female presents with daughter. Elongated, hypertrophic, incurvated toenails with palpable tenderness in all nail plates.       Assessment & Plan:   Assessment: Symptomatic onychomycoses x10  Plan: All 10 toenails are debrided back without any bleeding. Reappoint at three-month intervals.

## 2013-04-30 ENCOUNTER — Ambulatory Visit (INDEPENDENT_AMBULATORY_CARE_PROVIDER_SITE_OTHER): Payer: 59 | Admitting: Internal Medicine

## 2013-04-30 ENCOUNTER — Encounter: Payer: Self-pay | Admitting: Internal Medicine

## 2013-04-30 VITALS — BP 132/72 | HR 83 | Temp 97.0°F | Wt 130.6 lb

## 2013-04-30 DIAGNOSIS — Z0000 Encounter for general adult medical examination without abnormal findings: Secondary | ICD-10-CM

## 2013-04-30 DIAGNOSIS — R634 Abnormal weight loss: Secondary | ICD-10-CM

## 2013-04-30 NOTE — Progress Notes (Signed)
Pre-visit discussion using our clinic review tool. No additional management support is needed unless otherwise documented below in the visit note.  

## 2013-04-30 NOTE — Patient Instructions (Addendum)
It was good to see you today.  We have reviewed your prior records including labs and tests today  Health Maintenance reviewed - all recommended immunizations and age-appropriate screenings are up-to-date.  Medications reviewed and updated, no changes recommended at this time. Refill on medication(s) as discussed today.  Please consume one can of Ensure (or Boost) every day  Please schedule followup in 6 months, call sooner if problems.    Health Maintenance, Female A healthy lifestyle and preventative care can promote health and wellness.  Maintain regular health, dental, and eye exams.  Eat a healthy diet. Foods like vegetables, fruits, whole grains, low-fat dairy products, and lean protein foods contain the nutrients you need without too many calories. Decrease your intake of foods high in solid fats, added sugars, and salt. Get information about a proper diet from your caregiver, if necessary.  Regular physical exercise is one of the most important things you can do for your health. Most adults should get at least 150 minutes of moderate-intensity exercise (any activity that increases your heart rate and causes you to sweat) each week. In addition, most adults need muscle-strengthening exercises on 2 or more days a week.   Maintain a healthy weight. The body mass index (BMI) is a screening tool to identify possible weight problems. It provides an estimate of body fat based on height and weight. Your caregiver can help determine your BMI, and can help you achieve or maintain a healthy weight. For adults 20 years and older:  A BMI below 18.5 is considered underweight.  A BMI of 18.5 to 24.9 is normal.  A BMI of 25 to 29.9 is considered overweight.  A BMI of 30 and above is considered obese.  Maintain normal blood lipids and cholesterol by exercising and minimizing your intake of saturated fat. Eat a balanced diet with plenty of fruits and vegetables. Blood tests for lipids and  cholesterol should begin at age 67 and be repeated every 5 years. If your lipid or cholesterol levels are high, you are over 50, or you are a high risk for heart disease, you may need your cholesterol levels checked more frequently.Ongoing high lipid and cholesterol levels should be treated with medicines if diet and exercise are not effective.  If you smoke, find out from your caregiver how to quit. If you do not use tobacco, do not start.  Lung cancer screening is recommended for adults aged 92 80 years who are at high risk for developing lung cancer because of a history of smoking. Yearly low-dose computed tomography (CT) is recommended for people who have at least a 30-pack-year history of smoking and are a current smoker or have quit within the past 15 years. A pack year of smoking is smoking an average of 1 pack of cigarettes a day for 1 year (for example: 1 pack a day for 30 years or 2 packs a day for 15 years). Yearly screening should continue until the smoker has stopped smoking for at least 15 years. Yearly screening should also be stopped for people who develop a health problem that would prevent them from having lung cancer treatment.  If you are pregnant, do not drink alcohol. If you are breastfeeding, be very cautious about drinking alcohol. If you are not pregnant and choose to drink alcohol, do not exceed 1 drink per day. One drink is considered to be 12 ounces (355 mL) of beer, 5 ounces (148 mL) of wine, or 1.5 ounces (44 mL) of liquor.  Avoid use of street drugs. Do not share needles with anyone. Ask for help if you need support or instructions about stopping the use of drugs.  High blood pressure causes heart disease and increases the risk of stroke. Blood pressure should be checked at least every 1 to 2 years. Ongoing high blood pressure should be treated with medicines, if weight loss and exercise are not effective.  If you are 28 to 77 years old, ask your caregiver if you should  take aspirin to prevent strokes.  Diabetes screening involves taking a blood sample to check your fasting blood sugar level. This should be done once every 3 years, after age 37, if you are within normal weight and without risk factors for diabetes. Testing should be considered at a younger age or be carried out more frequently if you are overweight and have at least 1 risk factor for diabetes.  Breast cancer screening is essential preventative care for women. You should practice "breast self-awareness." This means understanding the normal appearance and feel of your breasts and may include breast self-examination. Any changes detected, no matter how small, should be reported to a caregiver. Women in their 68s and 30s should have a clinical breast exam (CBE) by a caregiver as part of a regular health exam every 1 to 3 years. After age 70, women should have a CBE every year. Starting at age 60, women should consider having a mammogram (breast X-ray) every year. Women who have a family history of breast cancer should talk to their caregiver about genetic screening. Women at a high risk of breast cancer should talk to their caregiver about having an MRI and a mammogram every year.  Breast cancer gene (BRCA)-related cancer risk assessment is recommended for women who have family members with BRCA-related cancers. BRCA-related cancers include breast, ovarian, tubal, and peritoneal cancers. Having family members with these cancers may be associated with an increased risk for harmful changes (mutations) in the breast cancer genes BRCA1 and BRCA2. Results of the assessment will determine the need for genetic counseling and BRCA1 and BRCA2 testing.  The Pap test is a screening test for cervical cancer. Women should have a Pap test starting at age 75. Between ages 48 and 88, Pap tests should be repeated every 2 years. Beginning at age 45, you should have a Pap test every 3 years as long as the past 3 Pap tests have  been normal. If you had a hysterectomy for a problem that was not cancer or a condition that could lead to cancer, then you no longer need Pap tests. If you are between ages 31 and 47, and you have had normal Pap tests going back 10 years, you no longer need Pap tests. If you have had past treatment for cervical cancer or a condition that could lead to cancer, you need Pap tests and screening for cancer for at least 20 years after your treatment. If Pap tests have been discontinued, risk factors (such as a new sexual partner) need to be reassessed to determine if screening should be resumed. Some women have medical problems that increase the chance of getting cervical cancer. In these cases, your caregiver may recommend more frequent screening and Pap tests.  The human papillomavirus (HPV) test is an additional test that may be used for cervical cancer screening. The HPV test looks for the virus that can cause the cell changes on the cervix. The cells collected during the Pap test can be tested for HPV. The  HPV test could be used to screen women aged 20 years and older, and should be used in women of any age who have unclear Pap test results. After the age of 42, women should have HPV testing at the same frequency as a Pap test.  Colorectal cancer can be detected and often prevented. Most routine colorectal cancer screening begins at the age of 51 and continues through age 21. However, your caregiver may recommend screening at an earlier age if you have risk factors for colon cancer. On a yearly basis, your caregiver may provide home test kits to check for hidden blood in the stool. Use of a small camera at the end of a tube, to directly examine the colon (sigmoidoscopy or colonoscopy), can detect the earliest forms of colorectal cancer. Talk to your caregiver about this at age 54, when routine screening begins. Direct examination of the colon should be repeated every 5 to 10 years through age 50, unless early  forms of pre-cancerous polyps or small growths are found.  Hepatitis C blood testing is recommended for all people born from [birth year] through 1963 and any individual with known risks for hepatitis C.  Practice safe sex. Use condoms and avoid high-risk sexual practices to reduce the spread of sexually transmitted infections (STIs). Sexually active women aged 61 and younger should be checked for Chlamydia, which is a common sexually transmitted infection. Older women with new or multiple partners should also be tested for Chlamydia. Testing for other STIs is recommended if you are sexually active and at increased risk.  Osteoporosis is a disease in which the bones lose minerals and strength with aging. This can result in serious bone fractures. The risk of osteoporosis can be identified using a bone density scan. Women ages 77 and over and women at risk for fractures or osteoporosis should discuss screening with their caregivers. Ask your caregiver whether you should be taking a calcium supplement or vitamin D to reduce the rate of osteoporosis.  Menopause can be associated with physical symptoms and risks. Hormone replacement therapy is available to decrease symptoms and risks. You should talk to your caregiver about whether hormone replacement therapy is right for you.  Use sunscreen. Apply sunscreen liberally and repeatedly throughout the day. You should seek shade when your shadow is shorter than you. Protect yourself by wearing long sleeves, pants, a wide-brimmed hat, and sunglasses year round, whenever you are outdoors.  Notify your caregiver of new moles or changes in moles, especially if there is a change in shape or color. Also notify your caregiver if a mole is larger than the size of a pencil eraser.  Stay current with your immunizations. Document Released: 11/09/2010 Document Revised: 08/21/2012 Document Reviewed: 11/09/2010 [facility] Patient Information 2014 [place], [place].

## 2013-04-30 NOTE — Progress Notes (Signed)
Subjective:    Patient ID: April Conway, female    DOB: 1922/08/31, 79 y.o.   MRN: [id number]  HPI  Here for annual physical and wellness  Diet: heart healthy  Physical activity: sedentary Depression/mood screen: negative Hearing: B hearing aides Visual acuity: grossly normal, performs annual eye exam  ADLs: capable Fall risk: none Home safety: good Cognitive evaluation: intact to orientation, naming, recall and repetition EOL planning: adv directives in place per pt/dtr  I have personally reviewed and have noted 1. The patient's medical and social history 2. Their use of alcohol, tobacco or illicit drugs 3. Their current medications and supplements 4. The patient's functional ability including ADL's, fall risks, home safety risks and hearing or visual impairment. 5. Diet and physical activities 6. Evidence for depression or mood disorders  Also  reviewed chronic medical issues and interval medical issues  Past Medical History  Diagnosis Date  . CONDUCTIVE HEARING LOSS BILATERAL   . OSTEOPENIA     DEXA 08/2005: -1.7 L spine  . URINARY INCONTINENCE, MIXED   . DYSLIPIDEMIA   . HYPERTENSION   . SEIZURE DISORDER 1965    bkthru 1999, then 02/2011 following dc of AEDs  . GEN OSTEOARTHROSIS INVOLVING MULTIPLE SITES    Family History  Problem Relation Age of Onset  . Diabetes Father    History  Substance Use Topics  . Smoking status: Never Smoker   . Smokeless tobacco: Never Used     Comment: Widow/widower. 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 One daughter nearby calls every night, other dgt in [place] area  . Alcohol Use: No    Review of Systems  Constitutional: Positive for unexpected weight change. Negative for fever and fatigue.  Respiratory: Negative for cough, shortness of breath and wheezing.   Cardiovascular: Negative for chest pain, palpitations and leg swelling.  Gastrointestinal: Negative for nausea, abdominal pain and diarrhea.  Genitourinary: Negative for  urgency, frequency, difficulty urinating and pelvic pain.  Neurological: Negative for dizziness, seizures, syncope, weakness, light-headedness and headaches.  Psychiatric/Behavioral: Negative for dysphoric mood. The patient is not nervous/anxious.   All other systems reviewed and are negative.       Objective:   Physical Exam BP 132/72  Pulse 83  Temp(Src) 97 F (36.1 C) (Oral)  Wt 130 lb 9.6 oz (59.24 kg)  SpO2 95% Wt Readings from Last 3 Encounters:  04/30/13 130 lb 9.6 oz (59.24 kg)  10/30/12 135 lb (61.236 kg)  05/17/12 142 lb (64.411 kg)   Constitutional: She appears well-developed and well-nourished. No distress. Dtr at side HENT: Head: Normocephalic. Mouth/Throat: No oropharyngeal exudate or redness. No hearing on left side (chronic) Cardiovascular: Normal rate, regular rhythm and normal heart sounds. no rub or murmur heard. no BLE edema Pulmonary/Chest: Effort normal and breath sounds normal. No respiratory distress or wheeze.  Psychiatric: She has a normal mood and affect. Her behavior is normal.   Very pleasant and bright  Neurology - CN2-12 symmetrically intact; MAE with good strength; cognition, balance and memory normal  Lab Results  Component Value Date   WBC 7.0 10/30/2012   HGB 13.8 10/30/2012   HCT 41.2 10/30/2012   PLT 245.0 10/30/2012   CHOL 175 08/23/2011   TRIG 66 08/23/2011   HDL 77 08/23/2011   LDLDIRECT 139.9 08/12/2009   ALT 13 10/30/2012   AST 19 10/30/2012   NA 138 10/30/2012   K 4.8 10/30/2012   CL 100 10/30/2012   CREATININE 0.8 10/30/2012   BUN 16 10/30/2012   CO2  31 10/30/2012   TSH 1.76 10/30/2012   INR 0.99 08/22/2011   HGBA1C 5.9 10/30/2012       Assessment & Plan:   CPX/v70.0 - Today patient counseled on age appropriate routine health concerns for screening and prevention, each reviewed and up to date or declined. Immunizations reviewed and up to date or declined. Labs reviewed. Risk factors for depression reviewed and negative. Hearing function  and visual acuity are stable. ADLs screened and addressed as needed. Functional ability and level of safety reviewed and appropriate. Education, counseling and referrals performed based on assessed risks today. Patient provided with a copy of personalized plan for preventive services.   Weight loss, unintentional -Unintentional weight loss - >10# over past 36mo - denies change in appetite or localizing GI symptoms - 10/2012 screening labs including a1c unremarkable- recommended Boost or Ensure daily

## 2013-06-11 ENCOUNTER — Other Ambulatory Visit: Payer: Self-pay | Admitting: Internal Medicine

## 2013-06-25 ENCOUNTER — Ambulatory Visit (INDEPENDENT_AMBULATORY_CARE_PROVIDER_SITE_OTHER): Payer: Medicare Other | Admitting: Podiatry

## 2013-06-25 ENCOUNTER — Encounter: Payer: Self-pay | Admitting: Podiatry

## 2013-06-25 VITALS — BP 158/84 | HR 77 | Resp 18

## 2013-06-25 DIAGNOSIS — L84 Corns and callosities: Secondary | ICD-10-CM

## 2013-06-25 DIAGNOSIS — M79609 Pain in unspecified limb: Secondary | ICD-10-CM

## 2013-06-25 DIAGNOSIS — B351 Tinea unguium: Secondary | ICD-10-CM

## 2013-06-25 NOTE — Progress Notes (Signed)
   Subjective:    Patient ID: April Conway, female    DOB: 08/30/1922, 80 y.o.   MRN: [id number]  HPI my toenails need a trimming. Asian complaining of painful toenails and keratoses. Last visit for a similar service was 04/02/2013    Review of Systems     Objective:   Physical Exam Orientated x3 white female  Dermatological: Hypertrophic, elongated, brittle, toenails x10. Keratoses dorsal second right toe, plantar third left MPJ, and and plantar first and second MPJ right.       Assessment & Plan:   Assessment: Symptomatic onychomycoses x10 Keratoses x4  Plan: Nails x10 and keratoses times were debrided without any bleeding. Reappoint at three-month intervals.

## 2013-07-29 ENCOUNTER — Emergency Department (HOSPITAL_COMMUNITY): Payer: 59

## 2013-07-29 ENCOUNTER — Observation Stay (HOSPITAL_COMMUNITY)
Admission: EM | Admit: 2013-07-29 | Discharge: 2013-08-02 | Disposition: A | Discharge status: Home / Self Care | Payer: 59 | Attending: Internal Medicine | Admitting: Internal Medicine

## 2013-07-29 ENCOUNTER — Encounter (HOSPITAL_COMMUNITY): Payer: Self-pay | Admitting: Emergency Medicine

## 2013-07-29 DIAGNOSIS — I10 Essential (primary) hypertension: Secondary | ICD-10-CM | POA: Insufficient documentation

## 2013-07-29 DIAGNOSIS — M169 Osteoarthritis of hip, unspecified: Principal | ICD-10-CM | POA: Insufficient documentation

## 2013-07-29 DIAGNOSIS — H900 Conductive hearing loss, bilateral: Secondary | ICD-10-CM

## 2013-07-29 DIAGNOSIS — M949 Disorder of cartilage, unspecified: Secondary | ICD-10-CM

## 2013-07-29 DIAGNOSIS — M1610 Unilateral primary osteoarthritis, unspecified hip: Principal | ICD-10-CM | POA: Insufficient documentation

## 2013-07-29 DIAGNOSIS — M159 Polyosteoarthritis, unspecified: Secondary | ICD-10-CM

## 2013-07-29 DIAGNOSIS — M25451 Effusion, right hip: Secondary | ICD-10-CM | POA: Diagnosis present

## 2013-07-29 DIAGNOSIS — Z23 Encounter for immunization: Secondary | ICD-10-CM | POA: Insufficient documentation

## 2013-07-29 DIAGNOSIS — M899 Disorder of bone, unspecified: Secondary | ICD-10-CM | POA: Insufficient documentation

## 2013-07-29 DIAGNOSIS — R55 Syncope and collapse: Secondary | ICD-10-CM

## 2013-07-29 DIAGNOSIS — M25559 Pain in unspecified hip: Secondary | ICD-10-CM

## 2013-07-29 DIAGNOSIS — S069X9A Unspecified intracranial injury with loss of consciousness of unspecified duration, initial encounter: Secondary | ICD-10-CM

## 2013-07-29 DIAGNOSIS — R262 Difficulty in walking, not elsewhere classified: Secondary | ICD-10-CM | POA: Insufficient documentation

## 2013-07-29 DIAGNOSIS — E785 Hyperlipidemia, unspecified: Secondary | ICD-10-CM | POA: Insufficient documentation

## 2013-07-29 DIAGNOSIS — R569 Unspecified convulsions: Secondary | ICD-10-CM | POA: Diagnosis present

## 2013-07-29 DIAGNOSIS — S069XAA Unspecified intracranial injury with loss of consciousness status unknown, initial encounter: Secondary | ICD-10-CM

## 2013-07-29 DIAGNOSIS — R300 Dysuria: Secondary | ICD-10-CM

## 2013-07-29 DIAGNOSIS — G40909 Epilepsy, unspecified, not intractable, without status epilepticus: Secondary | ICD-10-CM | POA: Insufficient documentation

## 2013-07-29 DIAGNOSIS — M25459 Effusion, unspecified hip: Secondary | ICD-10-CM

## 2013-07-29 DIAGNOSIS — N3946 Mixed incontinence: Secondary | ICD-10-CM | POA: Insufficient documentation

## 2013-07-29 DIAGNOSIS — M25551 Pain in right hip: Secondary | ICD-10-CM | POA: Diagnosis present

## 2013-07-29 LAB — CBC WITH DIFFERENTIAL/PLATELET
Basophils Absolute: 0 10*3/uL (ref 0.0–0.1)
Basophils Relative: 0 % (ref 0–1)
EOS PCT: 1 % (ref 0–5)
Eosinophils Absolute: 0 10*3/uL (ref 0.0–0.7)
HEMATOCRIT: 32.4 % — AB (ref 36.0–46.0)
HEMOGLOBIN: 10.7 g/dL — AB (ref 12.0–15.0)
LYMPHS ABS: 1.6 10*3/uL (ref 0.7–4.0)
LYMPHS PCT: 23 % (ref 12–46)
MCH: 30.7 pg (ref 26.0–34.0)
MCHC: 33 g/dL (ref 30.0–36.0)
MCV: 93.1 fL (ref 78.0–100.0)
Monocytes Absolute: 0.8 10*3/uL (ref 0.1–1.0)
Monocytes Relative: 11 % (ref 3–12)
NEUTROS ABS: 4.5 10*3/uL (ref 1.7–7.7)
Neutrophils Relative %: 65 % (ref 43–77)
Platelets: 314 10*3/uL (ref 150–400)
RBC: 3.48 MIL/uL — AB (ref 3.87–5.11)
RDW: 12.4 % (ref 11.5–15.5)
WBC: 6.9 10*3/uL (ref 4.0–10.5)

## 2013-07-29 LAB — BASIC METABOLIC PANEL
BUN: 15 mg/dL (ref 6–23)
CO2: 28 meq/L (ref 19–32)
CREATININE: 0.63 mg/dL (ref 0.50–1.10)
Calcium: 9.1 mg/dL (ref 8.4–10.5)
Chloride: 101 mEq/L (ref 96–112)
GFR calc Af Amer: >90 mL/min (ref >90)
GFR calc non Af Amer: 78 mL/min — ABNORMAL LOW (ref >90)
GLUCOSE: 89 mg/dL (ref 70–99)
Potassium: 4.1 mEq/L (ref 3.7–5.3)
Sodium: 140 mEq/L (ref 137–147)

## 2013-07-29 LAB — URINALYSIS, ROUTINE W REFLEX MICROSCOPIC
Bilirubin Urine: NEGATIVE
GLUCOSE, UA: NEGATIVE mg/dL
HGB URINE DIPSTICK: NEGATIVE
KETONES UR: NEGATIVE mg/dL
Nitrite: NEGATIVE
Protein, ur: NEGATIVE mg/dL
Specific Gravity, Urine: 1.017 (ref 1.005–1.030)
Urobilinogen, UA: 1 mg/dL (ref 0.0–1.0)
pH: 8 (ref 5.0–8.0)

## 2013-07-29 LAB — URINE MICROSCOPIC-ADD ON

## 2013-07-29 MED ORDER — ONDANSETRON HCL 4 MG PO TABS: 4.0000 mg | ORAL_TABLET | Freq: Four times a day (QID) | ORAL | Status: DC | PRN

## 2013-07-29 MED ORDER — ENOXAPARIN SODIUM 40 MG/0.4ML ~~LOC~~ SOLN
40.0000 mg | SUBCUTANEOUS | Status: DC
  Administered 2013-07-29 – 2013-07-30 (×2): 40 mg via SUBCUTANEOUS
  Filled 2013-07-29 (×3): qty 0.4

## 2013-07-29 MED ORDER — ACETAMINOPHEN 650 MG RE SUPP: 650.0000 mg | Freq: Four times a day (QID) | RECTAL | Status: DC | PRN

## 2013-07-29 MED ORDER — IBUPROFEN 200 MG PO TABS
600.0000 mg | ORAL_TABLET | Freq: Once | ORAL | Status: AC
  Administered 2013-07-29: 600 mg via ORAL
  Filled 2013-07-29: qty 3

## 2013-07-29 MED ORDER — ACETAMINOPHEN 325 MG PO TABS
650.0000 mg | ORAL_TABLET | Freq: Four times a day (QID) | ORAL | Status: DC | PRN
  Administered 2013-07-29 – 2013-08-01 (×9): 650 mg via ORAL
  Filled 2013-07-29 (×8): qty 2

## 2013-07-29 MED ORDER — MORPHINE SULFATE 2 MG/ML IJ SOLN
0.5000 mg | INTRAMUSCULAR | Status: DC | PRN
  Administered 2013-07-31 – 2013-08-01 (×2): 0.5 mg via INTRAVENOUS
  Filled 2013-07-29 (×2): qty 1

## 2013-07-29 MED ORDER — HYDROCODONE-ACETAMINOPHEN 5-325 MG PO TABS
1.0000 | ORAL_TABLET | Freq: Once | ORAL | Status: AC
  Administered 2013-07-29: 1 via ORAL
  Filled 2013-07-29: qty 1

## 2013-07-29 MED ORDER — ENSURE COMPLETE PO LIQD
237.0000 mL | Freq: Once | ORAL | Status: DC
Start: 2013-07-29 — End: 2013-07-29
  Filled 2013-07-29: qty 237

## 2013-07-29 MED ORDER — ONDANSETRON HCL 4 MG/2ML IJ SOLN: 4.0000 mg | Freq: Four times a day (QID) | INTRAMUSCULAR | Status: DC | PRN

## 2013-07-29 MED ORDER — LEVETIRACETAM 500 MG PO TABS
1000.0000 mg | ORAL_TABLET | Freq: Every day | ORAL | Status: DC
  Administered 2013-07-29 – 2013-08-01 (×4): 1000 mg via ORAL
  Filled 2013-07-29 (×5): qty 2

## 2013-07-29 MED ORDER — SENNA 8.6 MG PO TABS
1.0000 | ORAL_TABLET | Freq: Two times a day (BID) | ORAL | Status: DC
  Administered 2013-07-29 – 2013-08-02 (×8): 8.6 mg via ORAL
  Filled 2013-07-29 (×9): qty 1

## 2013-07-29 MED ORDER — OXYCODONE HCL 5 MG PO TABS
5.0000 mg | ORAL_TABLET | ORAL | Status: DC | PRN
  Administered 2013-07-31 – 2013-08-01 (×3): 5 mg via ORAL
  Filled 2013-07-29 (×3): qty 1
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
## 2013-07-29 NOTE — ED Notes (Signed)
Pt presents to department via [facility] for evaluation of chronic R hip pain. History of arthritis. History of non-compliance with medications. 8/10 R hip pain upon arrival. Denies recent injury. Pt is alert and oriented x4. No signs of distress noted.
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
## 2013-07-29 NOTE — ED Provider Notes (Signed)
Medical screening examination/treatment/procedure(s) were conducted as a shared visit with non-physician practitioner(s) and myself.  I personally evaluated the patient during the encounter.  Muse not working; ECG: Sinus rhythm, ventricular rate 68, normal axis, premature atrial complex, no acute ischemic changes noted, no comparison ECG immediately available  Isolated right hip pain and tenderness on examination dorsalis pedis pulse intact normal light touch good movement right foot with no tenderness to the right foot ankle lower leg or knee or back.  Hurman HornJohn M Gaelle Adriance, MD 07/30/13 [phone]
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
## 2013-07-30 ENCOUNTER — Inpatient Hospital Stay (HOSPITAL_COMMUNITY): Payer: 59

## 2013-07-30 DIAGNOSIS — I10 Essential (primary) hypertension: Secondary | ICD-10-CM

## 2013-07-30 LAB — CBC
HCT: 32.8 % — ABNORMAL LOW (ref 36.0–46.0)
Hemoglobin: 10.9 g/dL — ABNORMAL LOW (ref 12.0–15.0)
MCH: 30.9 pg (ref 26.0–34.0)
MCHC: 33.2 g/dL (ref 30.0–36.0)
MCV: 92.9 fL (ref 78.0–100.0)
PLATELETS: 362 10*3/uL (ref 150–400)
RBC: 3.53 MIL/uL — ABNORMAL LOW (ref 3.87–5.11)
RDW: 12.6 % (ref 11.5–15.5)
WBC: 6.7 10*3/uL (ref 4.0–10.5)
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
## 2013-07-30 NOTE — Progress Notes (Signed)
     Subjective:  Patient reports pain as marked.  April Conway has minimal pain at rest, and says it better than when April Conway came into the hospital, but April Conway has not been able to lift her leg off of the bed, or ambulate yet.  Objective:   VITALS:   Filed Vitals:   07/29/13 1645 07/29/13 1757 07/29/13 2100 07/30/13 0700  BP: 154/87 119/57 141/51 121/58  Pulse: 77 80 78 76  Temp:  98 F (36.7 C) 98 F (36.7 C) 98.2 F (36.8 C)  TempSrc:  Oral Oral Oral  Resp: 14 18 18 16   Height:      Weight:      SpO2: 96% 99% 92% 98%    Right leg has passive range of motion 20 to 50 of flexion. April Conway will not let me move it any further than that, no internal or external rotation. EHL and FHL are firing.  Lab Results  Component Value Date   WBC 6.7 07/30/2013   HGB 10.9* 07/30/2013   HCT 32.8* 07/30/2013   MCV 92.9 07/30/2013   PLT 362 07/30/2013     Assessment/Plan:     Active Problems:   HYPERTENSION   SEIZURE DISORDER   Effusion of hip joint, right   Hip pain, right   Hip pain  April Conway continues to have activity limiting severe hip pain, and is not capable of ambulating. April Conway normally lives alone. We will plan to obtain the MRI today, and if this is negative, then April Conway can be treated as a severe osteoarthritis flare, with either intra-articular injection by interventional radiology, or alternatively a trial of oral steroids, versus hydrocodone. I have discussed again with her the option for hip replacement, which April Conway is not interested in pursuing. I will plan to follow along with you, and help in whatever way possible.   Jalise Zawistowski P 07/30/2013, 8:03 AM   Teryl LucyJoshua Katja Blue, MD Cell [phone]

## 2013-07-31 ENCOUNTER — Observation Stay (HOSPITAL_COMMUNITY): Payer: 59

## 2013-07-31 DIAGNOSIS — S069X9A Unspecified intracranial injury with loss of consciousness of unspecified duration, initial encounter: Secondary | ICD-10-CM

## 2013-07-31 DIAGNOSIS — M159 Polyosteoarthritis, unspecified: Secondary | ICD-10-CM

## 2013-07-31 DIAGNOSIS — S069XAA Unspecified intracranial injury with loss of consciousness status unknown, initial encounter: Secondary | ICD-10-CM

## 2013-07-31 MED ORDER — POLYETHYLENE GLYCOL 3350 17 G PO PACK
17.0000 g | PACK | Freq: Every day | ORAL | Status: DC
  Administered 2013-07-31 – 2013-08-02 (×3): 17 g via ORAL
  Filled 2013-07-31 (×4): qty 1

## 2013-07-31 MED ORDER — PNEUMOCOCCAL VAC POLYVALENT 25 MCG/0.5ML IJ INJ
0.5000 mL | INJECTION | INTRAMUSCULAR | Status: AC
  Administered 2013-08-02: 0.5 mL via INTRAMUSCULAR
  Filled 2013-07-31: qty 0.5

## 2013-07-31 NOTE — Progress Notes (Signed)
Patient without void this spontaneous shift, fluids encouraged.  Bladder scan showed 443ml, spoke with Dr. Benjamine MolaVann around 1530 today, instructions to bladder scan patient in 2 hours, and if volume showing more than 500, than place order to insert Foley catheter.  Patient with spontaneous void around 1800, bladder scan performed after showed 70ml in bladder.  Nursing will continue to monitor, fluids encouraged.

## 2013-07-31 NOTE — Progress Notes (Signed)
UR completed 
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
## 2013-07-31 NOTE — Progress Notes (Signed)
     Subjective:  Patient reports pain as moderate.  Still unable to move hip.  Objective:   VITALS:   Filed Vitals:   07/30/13 0700 07/30/13 1515 07/30/13 2105 07/31/13 0545  BP: 121/58 133/58 120/50 125/53  Pulse: 76 79 82 71  Temp: 98.2 F (36.8 C) 97.7 F (36.5 C) 99.1 F (37.3 C) 98.8 F (37.1 C)  TempSrc: Oral  Oral Oral  Resp: 16 16 16 16   Height:      Weight:      SpO2: 98% 97% 98% 99%    Neurologically intact Positive log roll, no IR or ER.  Lab Results  Component Value Date   WBC 6.7 07/30/2013   HGB 10.9* 07/30/2013   HCT 32.8* 07/30/2013   MCV 92.9 07/30/2013   PLT 362 07/30/2013     Assessment/Plan:     Principal Problem:   Hip pain, right Active Problems:   HYPERTENSION   SEIZURE DISORDER   Effusion of hip joint, right   Hip pain  She is scheduled for a bone scan today, but I'm not sure if this will distinguish between OA and fracture, would recommend discussing with nuc med prior to doing the study.  May be worth just doing an aspiration/injection in IR, if blood then consider fracture, if normal joint fluid, its OA.  Treat simultaneously with depomedrol and marcaine injection intraarticular.   Ezri Landers P 07/31/2013, 7:40 AM   Teryl LucyJoshua Estanislao Harmon, MD Cell [phone]

## 2013-08-01 ENCOUNTER — Observation Stay (HOSPITAL_COMMUNITY): Payer: 59

## 2013-08-01 MED ORDER — LIDOCAINE 5 % EX PTCH
1.0000 | MEDICATED_PATCH | CUTANEOUS | Status: DC
  Administered 2013-08-01: 1 via TRANSDERMAL
  Filled 2013-08-01 (×2): qty 1

## 2013-08-01 MED ORDER — METHYLPREDNISOLONE ACETATE 80 MG/ML IJ SUSP
INTRAMUSCULAR | Status: AC
  Filled 2013-08-01: qty 1

## 2013-08-01 MED ORDER — IOHEXOL 180 MG/ML  SOLN
10.0000 mL | Freq: Once | INTRAMUSCULAR | Status: AC | PRN
  Administered 2013-08-01: 5 mL via INTRA_ARTICULAR
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
## 2013-08-01 NOTE — Progress Notes (Signed)
Patient ID: April AmabileBarbara M Conway, female   DOB: 06/23/1922, 80 y.o.   MRN: [id number]     Subjective:  Patient reports pain as moderate to severe.  Patient states that he hip is very painful and she is not able to cross he legs.  Objective:   VITALS:   Filed Vitals:   07/31/13 0545 07/31/13 1400 07/31/13 2050 08/01/13 0533  BP: 125/53 130/61 149/68 139/63  Pulse: 71 76 74 73  Temp: 98.8 F (37.1 C) 98.1 F (36.7 C) 98.2 F (36.8 C) 97.7 F (36.5 C)  TempSrc: Oral  Oral Oral  Resp: 16 18 18 18   Height:      Weight:      SpO2: 99% 95% 93% 96%    ABD soft Sensation intact distally Dorsiflexion/Plantar flexion intact Patient has hip flex to 90 deg. Negative log roll  Internal rotation 0-10 External rotation 0-20  Lab Results  Component Value Date   WBC 6.7 07/30/2013   HGB 10.9* 07/30/2013   HCT 32.8* 07/30/2013   MCV 92.9 07/30/2013   PLT 362 07/30/2013     Assessment/Plan:     Principal Problem:   Hip pain, right Active Problems:   HYPERTENSION   SEIZURE DISORDER   Effusion of hip joint, right   Hip pain   Advance diet Up with therapy Continue plan per medicine. Leotis ShamesWBAT    DOUGLAS PARRY, BRANDON 08/01/2013, 8:44 AM  Discussed and agree with above.  Patient will likely need placement, would defer to primary team.  I don't think I have anything else to offer unless she decides on surgical intervention.  RTC with me in 2 weeks.  Teryl LucyJoshua Janaa Acero, MD Cell [phone]

## 2013-08-01 NOTE — Progress Notes (Signed)
UR Completed.  [place] [phone] 08/01/2013  

## 2013-08-02 MED ORDER — OXYCODONE HCL 5 MG PO TABS: 5.0000 mg | ORAL_TABLET | ORAL | Status: DC | PRN

## 2013-08-02 MED ORDER — LIDOCAINE 5 % EX PTCH: 1.0000 | MEDICATED_PATCH | CUTANEOUS | Status: DC
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
## 2013-08-02 NOTE — Progress Notes (Signed)
Physical Therapy Treatment Patient Details Name: April Conway MRN: [id number] DOB: 03/29/1923 Today's Date: 08/02/2013    History of Present Illness      PT Comments      Follow Up Recommendations  Home health PT     Equipment Recommendations  None recommended by PT    Recommendations for Other Services OT consult     Precautions / Restrictions Precautions Precautions: Fall Precaution Comments: Pt is very saftey conscious Restrictions Weight Bearing Restrictions: Yes Other Position/Activity Restrictions: WBAT    Mobility  Bed Mobility Overal bed mobility: Modified Independent Bed Mobility: Supine to Sit     Supine to sit: Modified independent (Device/Increase time)     General bed mobility comments: Increased time  Transfers Overall transfer level: Needs assistance Equipment used: Rolling walker (2 wheeled) Transfers: Sit to/from Stand Sit to Stand: Min guard         General transfer comment: min cues to pull feel further under  Ambulation/Gait Ambulation/Gait assistance: Min guard;Supervision Ambulation Distance (Feet): 60 Feet Assistive device: Rolling walker (2 wheeled) Gait Pattern/deviations: Step-to pattern;Step-through pattern;Decreased step length - right;Decreased step length - left;Shuffle;Trunk flexed Gait velocity: decr   General Gait Details: min cues for position from RW   Stairs Stairs: Yes Stairs assistance: Min guard Stair Management: Two rails;Forwards Number of Stairs: 10 General stair comments: min cues for sequence  Wheelchair Mobility    Modified Rankin (Stroke Patients Only)       Balance                                    Cognition Arousal/Alertness: Awake/alert Behavior During Therapy: WFL for tasks assessed/performed Overall Cognitive Status: Within Functional Limits for tasks assessed                      Exercises General Exercises - Lower Extremity Ankle Circles/Pumps: AROM;15  reps;Supine;Both Quad Sets: AROM;Both;10 reps;Supine Heel Slides: AAROM;Right;15 reps;Supine Hip ABduction/ADduction: AAROM;Right;10 reps;Supine    General Comments        Pertinent Vitals/Pain 3/10; premed    Home Living                      Prior Function            PT Goals (current goals can now be found in the care plan section) Acute Rehab PT Goals Patient Stated Goal: Home to resume previous lifestyle with decreased pain PT Goal Formulation: With patient Time For Goal Achievement: 08/08/13 Potential to Achieve Goals: Good Progress towards PT goals: Progressing toward goals    Frequency  Min 3X/week    PT Plan Current plan remains appropriate    End of Session Equipment Utilized During Treatment: Gait belt Activity Tolerance: Patient tolerated treatment well Patient left: in chair;with call bell/phone within reach;with family/visitor present     Time: 0810-0849 PT Time Calculation (min): 39 min  Charges:  $Gait Training: 8-22 mins $Therapeutic Exercise: 8-22 mins $Therapeutic Activity: 8-22 mins                    G Codes:      April Conway 08/02/2013, 12:16 PM

## 2013-08-02 NOTE — Progress Notes (Signed)
Patient ID: April AmabileBarbara M Schweppe, female   DOB: 09/06/1922, 80 y.o.   MRN: [id number]     Subjective:  Patient reports pain as moderate to severe.  Patient sleeping and in no acute distress.  When awake she follows commands and is aware of time and place.  Injection has helped with her pain.   Objective:   VITALS:   Filed Vitals:   08/01/13 0533 08/01/13 1300 08/01/13 2057 08/02/13 0547  BP: 139/63 151/53 137/62 140/68  Pulse: 73 72 78 73  Temp: 97.7 F (36.5 C) 98 F (36.7 C) 97.7 F (36.5 C) 97.9 F (36.6 C)  TempSrc: Oral     Resp: 18 18 16 16   Height:      Weight:      SpO2: 96% 98% 95% 95%    ABD soft Sensation intact distally Dorsiflexion/Plantar flexion intact Knee ankle and foot motion within normal limits. Hip flexion 0-90, abduction 0-30, and adduction neutral   Lab Results  Component Value Date   WBC 6.7 07/30/2013   HGB 10.9* 07/30/2013   HCT 32.8* 07/30/2013   MCV 92.9 07/30/2013   PLT 362 07/30/2013     Assessment/Plan:     Principal Problem:   Hip pain, right Active Problems:   HYPERTENSION   SEIZURE DISORDER   Effusion of hip joint, right   Hip pain   Advance diet Up with therapy Continue plan per medicine WBAT Ortho will follow up as needed  Ortho signing off   Haskel KhanDOUGLAS PARRY, BRANDON 08/02/2013, 7:29 AM  Discussed and agree with above.  RTC 2 weeks with me, or as needed.  Teryl LucyJoshua Zyair Russi, MD Cell [phone]

## 2013-08-02 NOTE — Care Management Note (Signed)
CARE MANAGEMENT NOTE 08/02/2013  Patient:  April Conway,April Conway   Account Number:  [id number]  Date Initiated:  08/02/2013  Documentation initiated by:  [facility]  Subjective/Objective Assessment:   admitted with rt hip pain  s/p right hip aspiration and injection.     Action/Plan:   patient will need home health PT, she has used [facility] and wants to do so now. [facility]. Patient has support ayt discharge.   Anticipated DC Date:  08/02/2013   Anticipated DC Plan:  HOME W HOME HEALTH SERVICES      DC Planning Services  CM consult      [facility]  [facility]   Choice offered to / List presented to:  C-1 Patient        HH arranged  HH-2 PT      [facility]  [facility]   Status of service:  Completed, signed off Medicare Important Message given?   (If response is "NO", the following Medicare IM given date fields will be blank) Date Medicare IM given:   Date Additional Medicare IM given:    Discharge Disposition:  HOME W HOME HEALTH SERVICES

## 2013-08-03 NOTE — Progress Notes (Signed)
08/01/13 1630  PT Time Calculation  PT Start Time 1630  PT Stop Time 1700  PT Time Calculation (min) 30 min  PT G-Codes **NOT FOR INPATIENT CLASS**  Functional Assessment Tool Used clinical judgement  Functional Limitation Mobility: Walking and moving around  Mobility: Walking and Moving Around Current Status (N8295(G8978) CJ  Mobility: Walking and Moving Around Goal Status (A2130(G8979) CI  PT General Charges  $$ ACUTE PT VISIT 1 Procedure  PT Evaluation  $Initial PT Evaluation Tier I 1 Procedure  PT Treatments  $Gait Training 8-22 mins

## 2013-08-20 ENCOUNTER — Telehealth: Payer: Self-pay | Admitting: *Deleted

## 2013-08-20 NOTE — Telephone Encounter (Signed)
Noted No new orders thanks

## 2013-08-20 NOTE — Telephone Encounter (Signed)
[facility] further [facility].  Please advise

## 2013-08-27 ENCOUNTER — Telehealth: Payer: Self-pay | Admitting: *Deleted

## 2013-08-27 DIAGNOSIS — M169 Osteoarthritis of hip, unspecified: Secondary | ICD-10-CM

## 2013-08-27 DIAGNOSIS — M159 Polyosteoarthritis, unspecified: Secondary | ICD-10-CM

## 2013-08-27 DIAGNOSIS — IMO0001 Reserved for inherently not codable concepts without codable children: Secondary | ICD-10-CM

## 2013-08-27 DIAGNOSIS — R569 Unspecified convulsions: Secondary | ICD-10-CM

## 2013-08-27 DIAGNOSIS — I10 Essential (primary) hypertension: Secondary | ICD-10-CM

## 2013-08-27 DIAGNOSIS — M1610 Unilateral primary osteoarthritis, unspecified hip: Secondary | ICD-10-CM

## 2013-08-27 NOTE — Telephone Encounter (Signed)
April Conway called concerned that her mother has decreased appetite, weight loss, and refuses to leave her house.  April Conway is requesting to speak with Dr Felicity CoyerLeschber however she is wanting her mother not to be contacted.  Please advise

## 2013-08-29 NOTE — Telephone Encounter (Signed)
Called patient's daughter Windell MouldingRuth Reviewed patient's unintentional weight loss of 25 pounds in past 2 years, accelerated since hospitalization 07/2013 for hip pain with decreased appetite and decreased intake  Review of meds patient is more confused than baseline, but feels patient generally appropriate in her decision-making and is declining aggressive intervention. Concern for occult malignancy or other metabolic disease  Family will arrange for patient to come in for visit in the next several weeks for general lab review and referral for CT -?head because of confusion or c/a/p for ?occult malignancy If malignancy identified, or if no reversible cause identified, family supports enrolling with home hospice to provide supportive care as per pt's desire Windell MouldingRuth recalls pt refusing follow up mammo 5885yrs ago because "she didn't want anything done even if they found something " and Windell MouldingRuth is supportive of her mother's wishes

## 2013-09-13 ENCOUNTER — Telehealth: Payer: Self-pay | Admitting: Internal Medicine

## 2013-09-13 NOTE — Telephone Encounter (Signed)
Patient's daughter, Windell MouldingRuth, called concerned and requesting to speak with Dr. Felicity CoyerLeschber about her Mother who refuses to come in for an OV. She wants to discuss what "other options" they may have. Windell MouldingRuth would like a call sometime after today if possible. Please advise.

## 2013-09-14 NOTE — Telephone Encounter (Signed)
I called - message/voice on answering machine reports "wiles", so ( left message only to return call to office, no advice left. Please ask one of my partners to help advise as needed if help needed prior to my return 5/21 Windell Moulding(Ruth advised of same in my voice mail message on her machine) thanks

## 2013-09-17 ENCOUNTER — Ambulatory Visit: Payer: Medicare Other | Admitting: Podiatry

## 2013-09-26 ENCOUNTER — Telehealth: Payer: Self-pay | Admitting: *Deleted

## 2013-09-26 NOTE — Telephone Encounter (Signed)
Pts daughter called [facility] were concerned with pts bilateral foot swelling.  Further states pt refuses to leave the house, which prohibits them from taking her to ER or OV.  Windell MouldingRuth is aware Dr Felicity CoyerLeschber is out til later this week, states she will wait MDs return.  Please advise

## 2013-09-26 NOTE — Telephone Encounter (Signed)
i will call again when i return to to

## 2013-09-27 MED ORDER — LEVETIRACETAM 500 MG PO TABS: 1000.0000 mg | ORAL_TABLET | Freq: Every day | ORAL | Status: AC

## 2013-09-27 NOTE — Telephone Encounter (Signed)
Call to April Conway on mobile Reviewed events of last few weeks Improved somewhat since 09/24/13 when [employer], but still poor mobility since fall Patient adamantly refusing to come in for medical visit and daughter committed to the filling patient's wishes Discussed role of home hospice consult -patient's daughter will consider, but plan to continue only with private home services at this time. Suggested application of TED hose for dependent edema Patient's daughter will call if hospice referral needed or if other problems, no prescription changes recommended

## 2013-10-02 ENCOUNTER — Telehealth: Payer: Self-pay | Admitting: Internal Medicine

## 2013-10-02 ENCOUNTER — Telehealth: Payer: Self-pay | Admitting: *Deleted

## 2013-10-02 DIAGNOSIS — W19XXXA Unspecified fall, initial encounter: Secondary | ICD-10-CM

## 2013-10-02 DIAGNOSIS — M549 Dorsalgia, unspecified: Secondary | ICD-10-CM

## 2013-10-02 DIAGNOSIS — R627 Adult failure to thrive: Secondary | ICD-10-CM

## 2013-10-02 DIAGNOSIS — Y92009 Unspecified place in unspecified non-institutional (private) residence as the place of occurrence of the external cause: Secondary | ICD-10-CM

## 2013-10-02 DIAGNOSIS — R634 Abnormal weight loss: Secondary | ICD-10-CM

## 2013-10-02 NOTE — Telephone Encounter (Signed)
Refer done - see prior phone note re: decline leading to this referral

## 2013-10-02 NOTE — Telephone Encounter (Signed)
Notified [facility] spoke with [place]...Raechel Chute

## 2013-10-02 NOTE — Telephone Encounter (Signed)
Pt's daughter called to say they want the referral to Hospice.  Have hospice contact Brigid Re at [phone].

## 2013-10-02 NOTE — Telephone Encounter (Signed)
Message copied by Deatra James on Tue Oct 02, 2013  1:48 PM ------      Message from: Janee Morn      Created: Tue Oct 02, 2013  9:07 AM        This is a  REFERRAL TO HOSPICE.  Thanks ------

## 2013-10-02 NOTE — Telephone Encounter (Signed)
We have notified [facility] spoke with Olegario Messier gave hosp information & daughter contact #...Raechel Chute

## 2013-10-11 ENCOUNTER — Telehealth: Payer: Self-pay | Admitting: Internal Medicine

## 2013-10-11 NOTE — Telephone Encounter (Signed)
April Conway from [facility] called and states that the patient is requesting a DNR.  [name] says that they need permission from Dr. Felicity Coyer for their staff physician Dr. Kern Reap to provide it for her and take it out. [name] can be reached back at Ph#:[phone]. She says a message can be left on her VM if she is not available.

## 2013-10-12 NOTE — Telephone Encounter (Signed)
Yes, ok for DNR by Dr Delanna Notice  i [facility] re: same as requested

## 2013-10-29 ENCOUNTER — Ambulatory Visit: Payer: 59 | Admitting: Internal Medicine

## 2013-11-05 ENCOUNTER — Telehealth: Payer: Self-pay | Admitting: Internal Medicine

## 2013-11-05 NOTE — Telephone Encounter (Signed)
Patient's daughter, Brigid ReRuth Wiles, is calling requesting to make an appointment with Dr. Felicity CoyerLeschber to discuss her mother health.  Patient will not be present at appointment.  Ms. Victorino DikeWiles says she is patient's POA.  Please advise on OV charge for this type of appointment if okay.

## 2013-11-05 NOTE — Telephone Encounter (Signed)
Since we can only bill insurance when pt present, appt would need to be a cash OV, est level 3 -  If April Conway ok with same, ok to schedule any slot "under" patient's name - thanks!

## 2013-11-08 ENCOUNTER — Ambulatory Visit (INDEPENDENT_AMBULATORY_CARE_PROVIDER_SITE_OTHER): Payer: 59 | Admitting: Internal Medicine

## 2013-11-08 DIAGNOSIS — F09 Unspecified mental disorder due to known physiological condition: Secondary | ICD-10-CM

## 2013-11-08 DIAGNOSIS — M25559 Pain in unspecified hip: Secondary | ICD-10-CM

## 2013-11-08 DIAGNOSIS — R627 Adult failure to thrive: Secondary | ICD-10-CM

## 2013-11-08 DIAGNOSIS — R4189 Other symptoms and signs involving cognitive functions and awareness: Secondary | ICD-10-CM

## 2013-11-08 DIAGNOSIS — M25551 Pain in right hip: Secondary | ICD-10-CM

## 2013-11-08 DIAGNOSIS — Z7409 Other reduced mobility: Secondary | ICD-10-CM

## 2013-11-08 DIAGNOSIS — R69 Illness, unspecified: Secondary | ICD-10-CM

## 2013-11-08 NOTE — Patient Instructions (Signed)
It was good to see you today.  We have reviewed your mother's interval history and recent decline  We will work through [facility] to arrange [facility] care given complications of safe care at home  You will talk with your hospice social worker Beth about these issues - she can contact me or Dr Linton Flemingson Hertwick as needed  We will submit FL2 and [facility] (note, hospice *may* be the ones to complete FL2 if needed)  Medications reviewed and updated, no changes recommended at this time.  Please schedule followup as needed, call sooner if problems.

## 2013-11-08 NOTE — Progress Notes (Signed)
Patient not examined today -office visit today with patient's daughter Windell MouldingRuth and son-in-law Link Snufferddie to reviewed interval history since patient's last office visit including hospitalization March 2015 because of R hip pain - (copy of family's written concerns scanned into EMR today)  Since discharge home, continued pain in hip, back and legs creating progressive immobility. Patient not walking/standing since May 6  Associated with contractures of knees and swelling feet>ankles  requiring transfer board to get out of bed to toilet or chair, unable to transfer indep - increasing difficulty with movement because of pt refusal to move or pain induced by movement Hoyer lift provided by hospice but refused by pt  Family concerned for patient safety because of refusal/inablity to move despite Home hospice visits (ongoing > 1 month) and [employer] [employer] [phone] M-F and overnight Sun, Tues and Thurs Other nights covered by Windell Mouldinguth All dinner meals covered by family  Patient has poor insight into her condition: -has asked family to discontinue to help because "it's not needed"  -instructed her family to leave on vacation for 2 weeks ("i'll just sit here in a chair until you get home - I'll be fine without you all") -increasing paranoia related to assistance -refusing shower and sponge bath  Also poor short-term recall -unable to accurately report to family when  well-known visitors/family have stopped in/called, even on same day.  Because of these problems and continued decline, family would like patient to reside in the skilled nursing facility and has [facility] re: same. Admission intake form by family has been completed. Reports need for FL2 and recent notes to be considered for admission.  2 primary concerns regarding potential transfer: 1) patient refuses - As family has healthcare power of attorney, and patient appears to lack capacity for informed medical descion making, I suspect it is  reasonable to move patient, but have instructed patient's family to work with [facility] re: same - I have also spoken with hospice attending Dr Delanna NoticeHertwick about same possibility by phone today  2) transfer would need to be via ambulance as pt can not move or sitting in regular car - this can be arranged with hospice when time is ready  Family expressed appreciation for assistance with the situation  Problem List Items Addressed This Visit   Hip pain, right    Other Visit Diagnoses   FTT (failure to thrive) in adult    -  Primary    Prolonged immobilization        Cognitive impairment

## 2013-11-22 DIAGNOSIS — I10 Essential (primary) hypertension: Secondary | ICD-10-CM

## 2013-11-22 DIAGNOSIS — M159 Polyosteoarthritis, unspecified: Secondary | ICD-10-CM

## 2013-11-22 DIAGNOSIS — F068 Other specified mental disorders due to known physiological condition: Secondary | ICD-10-CM

## 2013-11-22 DIAGNOSIS — E441 Mild protein-calorie malnutrition: Secondary | ICD-10-CM

## 2013-11-22 DIAGNOSIS — R609 Edema, unspecified: Secondary | ICD-10-CM

## 2013-11-22 DIAGNOSIS — G40909 Epilepsy, unspecified, not intractable, without status epilepticus: Secondary | ICD-10-CM

## 2013-11-23 ENCOUNTER — Telehealth: Payer: Self-pay | Admitting: Internal Medicine

## 2013-11-23 NOTE — Telephone Encounter (Signed)
Joylene IgoJan Parker called from Hospice to give an update on Mrs. Garald BraverGehrke.   Jan states patient [facility] to be [facility].  It was originally thought that Patient would transfer care to [facility]. But when [facility] a physician there did not think she was hospice eligible therefore her care was not transferred with Hospice.  If you have any questions you can call her back at [phone].

## 2013-11-23 NOTE — Telephone Encounter (Signed)
Noted. Thanks for the update.  

## 2013-12-25 DIAGNOSIS — I10 Essential (primary) hypertension: Secondary | ICD-10-CM

## 2013-12-25 DIAGNOSIS — M159 Polyosteoarthritis, unspecified: Secondary | ICD-10-CM

## 2013-12-25 DIAGNOSIS — F068 Other specified mental disorders due to known physiological condition: Secondary | ICD-10-CM

## 2013-12-25 DIAGNOSIS — G40909 Epilepsy, unspecified, not intractable, without status epilepticus: Secondary | ICD-10-CM

## 2014-01-18 DIAGNOSIS — F0280 Dementia in other diseases classified elsewhere without behavioral disturbance: Secondary | ICD-10-CM

## 2014-01-18 DIAGNOSIS — G40309 Generalized idiopathic epilepsy and epileptic syndromes, not intractable, without status epilepticus: Secondary | ICD-10-CM

## 2014-01-18 DIAGNOSIS — I10 Essential (primary) hypertension: Secondary | ICD-10-CM

## 2014-01-18 DIAGNOSIS — M1710 Unilateral primary osteoarthritis, unspecified knee: Secondary | ICD-10-CM

## 2014-01-18 DIAGNOSIS — IMO0002 Reserved for concepts with insufficient information to code with codable children: Secondary | ICD-10-CM

## 2014-01-18 DIAGNOSIS — G309 Alzheimer's disease, unspecified: Secondary | ICD-10-CM

## 2014-02-07 DIAGNOSIS — R569 Unspecified convulsions: Secondary | ICD-10-CM

## 2014-02-07 DIAGNOSIS — I10 Essential (primary) hypertension: Secondary | ICD-10-CM

## 2014-02-07 DIAGNOSIS — F0390 Unspecified dementia without behavioral disturbance: Secondary | ICD-10-CM

## 2014-02-07 DIAGNOSIS — M150 Primary generalized (osteo)arthritis: Secondary | ICD-10-CM

## 2014-04-10 ENCOUNTER — Ambulatory Visit: Payer: 59 | Admitting: Internal Medicine

## 2014-04-17 DIAGNOSIS — R569 Unspecified convulsions: Secondary | ICD-10-CM

## 2014-04-17 DIAGNOSIS — G309 Alzheimer's disease, unspecified: Secondary | ICD-10-CM

## 2014-04-17 DIAGNOSIS — I10 Essential (primary) hypertension: Secondary | ICD-10-CM

## 2014-04-17 DIAGNOSIS — M1990 Unspecified osteoarthritis, unspecified site: Secondary | ICD-10-CM

## 2014-06-25 DIAGNOSIS — I10 Essential (primary) hypertension: Secondary | ICD-10-CM | POA: Diagnosis not present

## 2014-06-25 DIAGNOSIS — M150 Primary generalized (osteo)arthritis: Secondary | ICD-10-CM | POA: Diagnosis not present

## 2014-06-25 DIAGNOSIS — R569 Unspecified convulsions: Secondary | ICD-10-CM | POA: Diagnosis not present

## 2014-06-25 DIAGNOSIS — F0150 Vascular dementia without behavioral disturbance: Secondary | ICD-10-CM | POA: Diagnosis not present

## 2014-07-17 DIAGNOSIS — L723 Sebaceous cyst: Secondary | ICD-10-CM | POA: Diagnosis not present

## 2014-08-23 DIAGNOSIS — I10 Essential (primary) hypertension: Secondary | ICD-10-CM | POA: Diagnosis not present

## 2014-08-23 DIAGNOSIS — R569 Unspecified convulsions: Secondary | ICD-10-CM

## 2014-08-23 DIAGNOSIS — M1990 Unspecified osteoarthritis, unspecified site: Secondary | ICD-10-CM | POA: Diagnosis not present

## 2014-08-23 DIAGNOSIS — G309 Alzheimer's disease, unspecified: Secondary | ICD-10-CM | POA: Diagnosis not present

## 2014-10-17 DIAGNOSIS — M159 Polyosteoarthritis, unspecified: Secondary | ICD-10-CM | POA: Diagnosis not present

## 2014-10-17 DIAGNOSIS — I10 Essential (primary) hypertension: Secondary | ICD-10-CM

## 2014-10-17 DIAGNOSIS — F0150 Vascular dementia without behavioral disturbance: Secondary | ICD-10-CM | POA: Diagnosis not present

## 2014-10-17 DIAGNOSIS — R569 Unspecified convulsions: Secondary | ICD-10-CM

## 2014-10-27 IMAGING — CT CT HIP*R* W/O CM
2 of 5 series · 17 of 46 positions shown, 19 images · non-contrast
Comparison: Radiographs 07/29/2013

CLINICAL DATA: Chronic right hip pain, history arthritis

EXAM:
CT OF THE RIGHT HIP WITHOUT CONTRAST
TECHNIQUE: Multidetector CT imaging was performed according to the standard
protocol. Multiplanar CT image reconstructions were also generated.

[Series 3: pelvis 3.0 i31f 3 · axial · 0.35mm/px · z∈[-184,-40]mm · 14 of 56 slices shown, 16 images]
[im 4/56  soft-tissue]
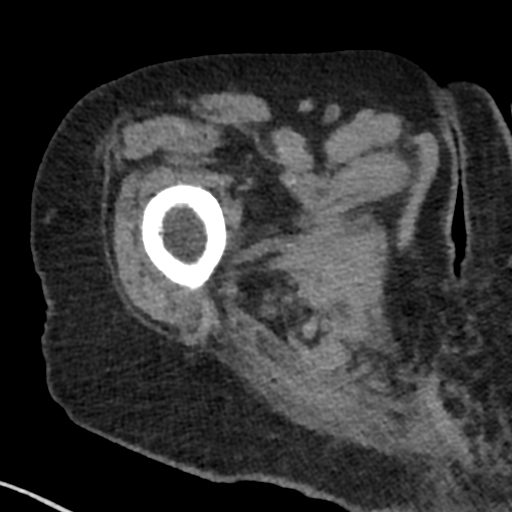
[im 4/56  bone]
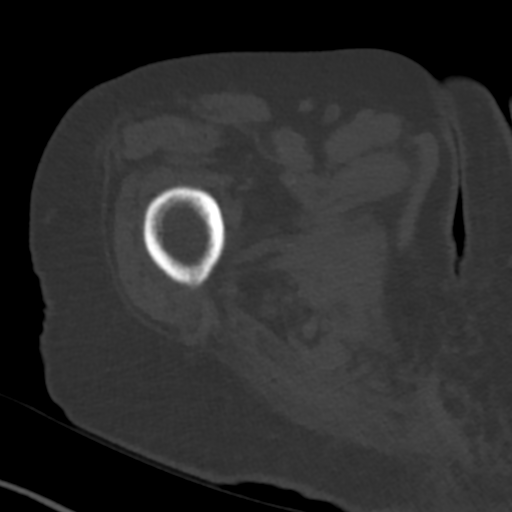
[im 8/56  soft-tissue]
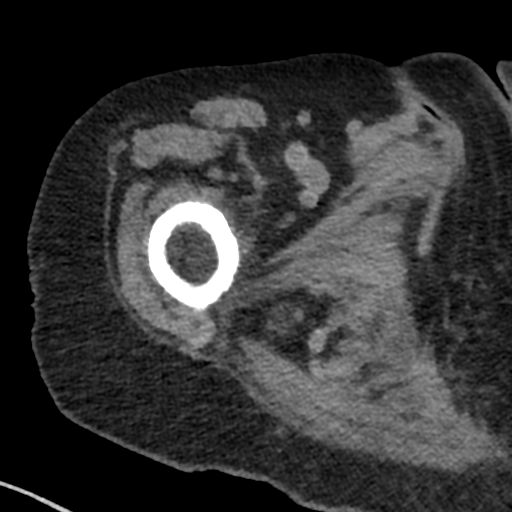
[im 11/56  soft-tissue]
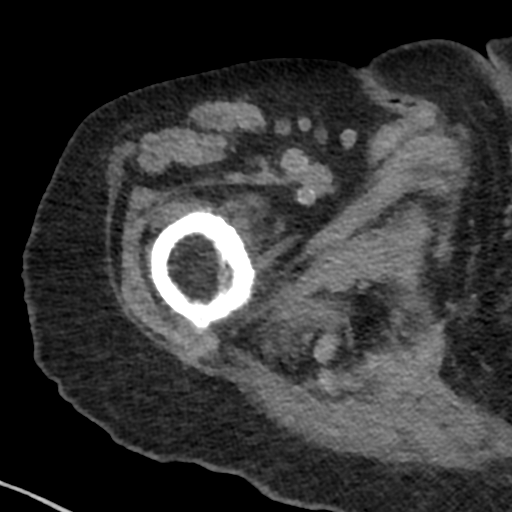
[im 15/56  soft-tissue]
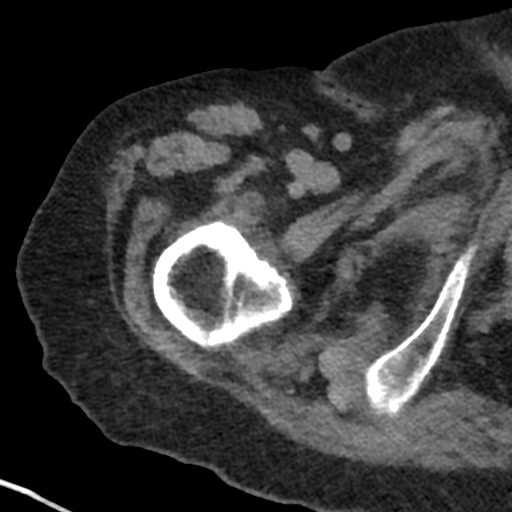
[im 18/56  soft-tissue]
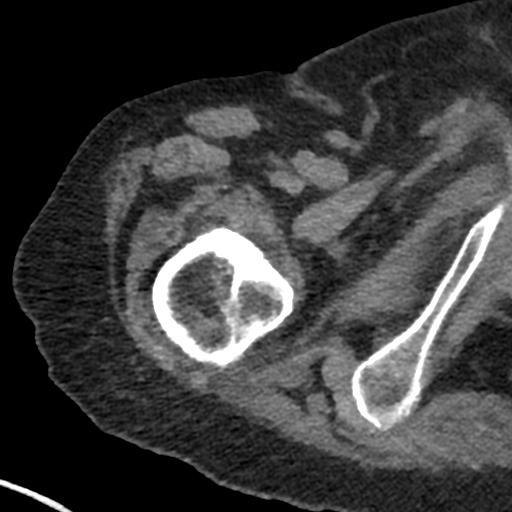
[im 22/56  soft-tissue]
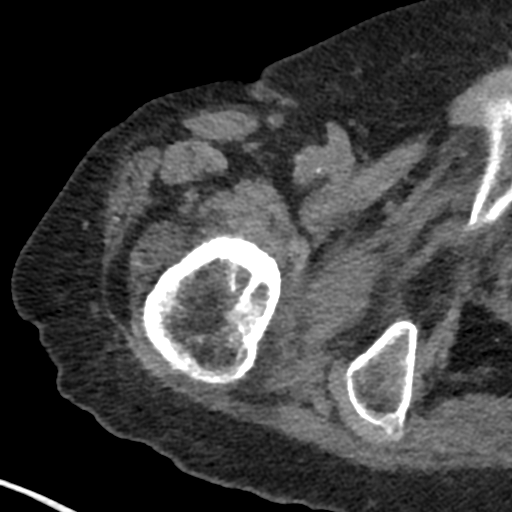
[im 25/56  soft-tissue]
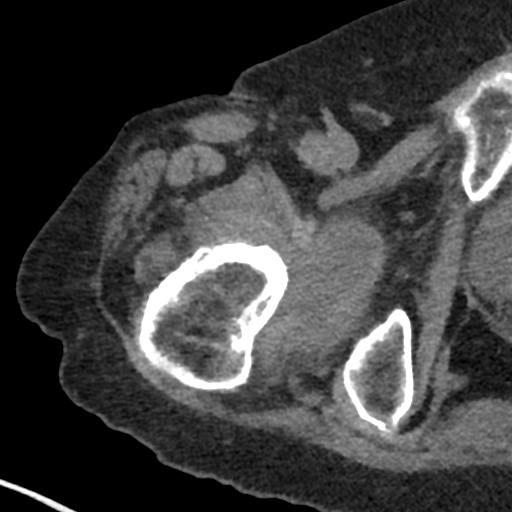
[im 31/56  soft-tissue]
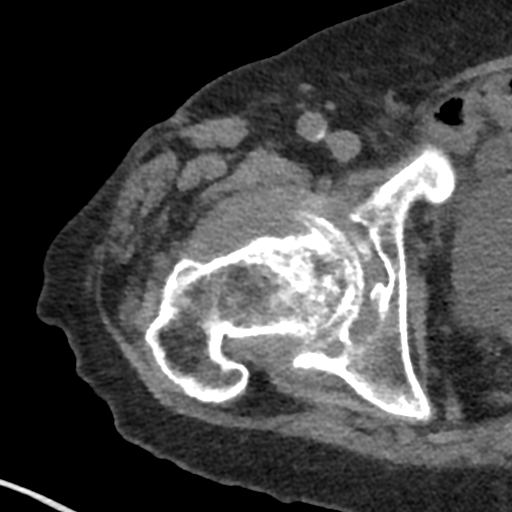
[im 34/56  soft-tissue]
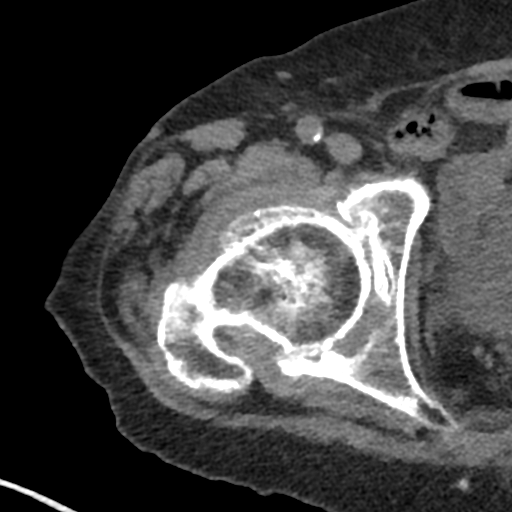
[im 34/56  bone]
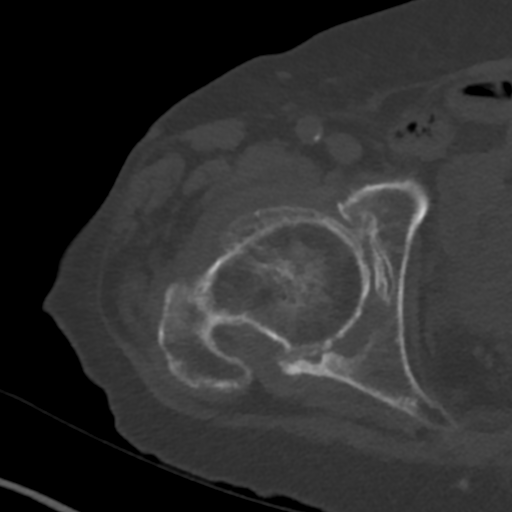
[im 38/56  soft-tissue]
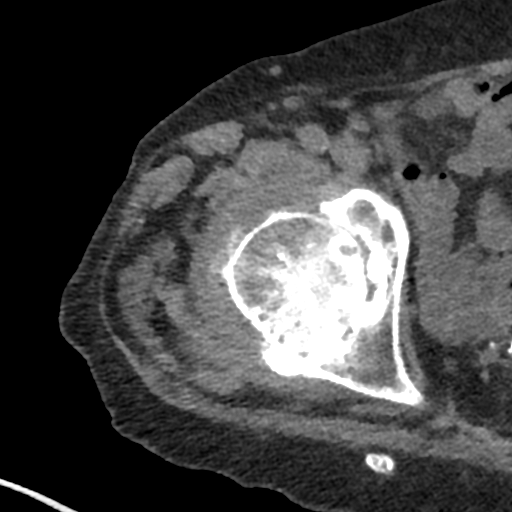
[im 41/56  soft-tissue]
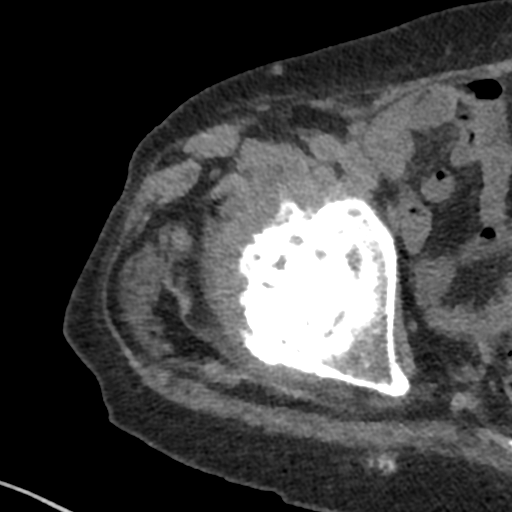
[im 45/56  soft-tissue]
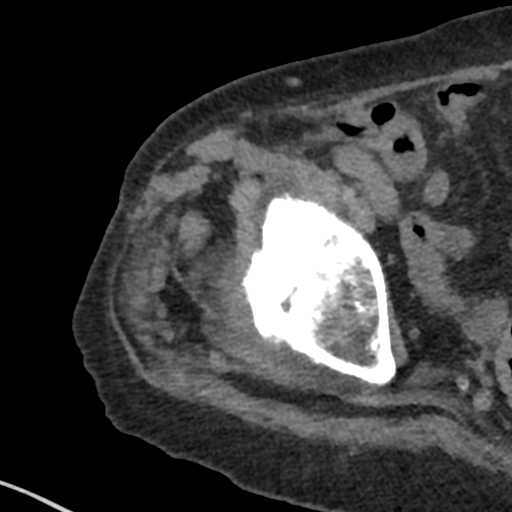
[im 48/56  soft-tissue]
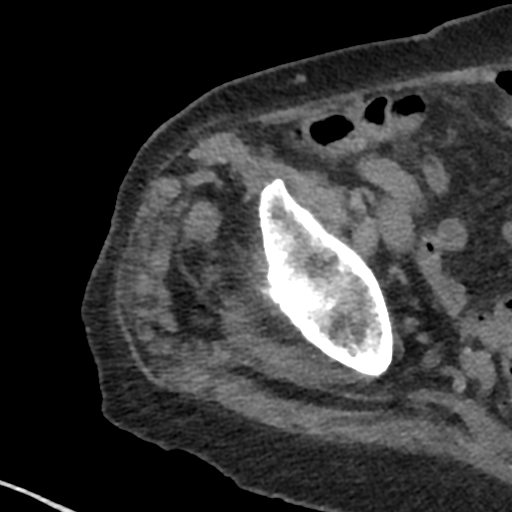
[im 52/56  soft-tissue]
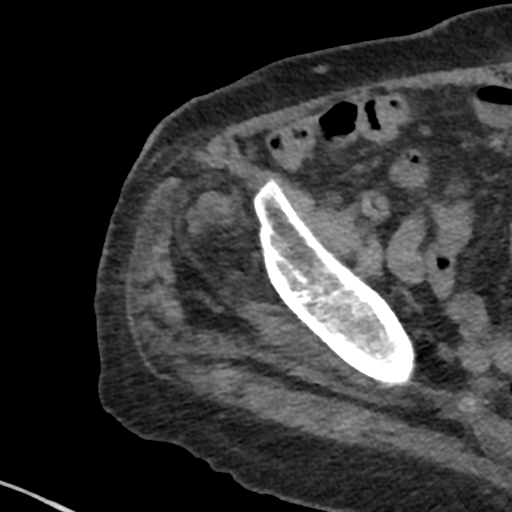

[Series 6: coronal · coronal · 0.43mm/px · 3 of 46 slices shown]
[im 12/46  soft-tissue]
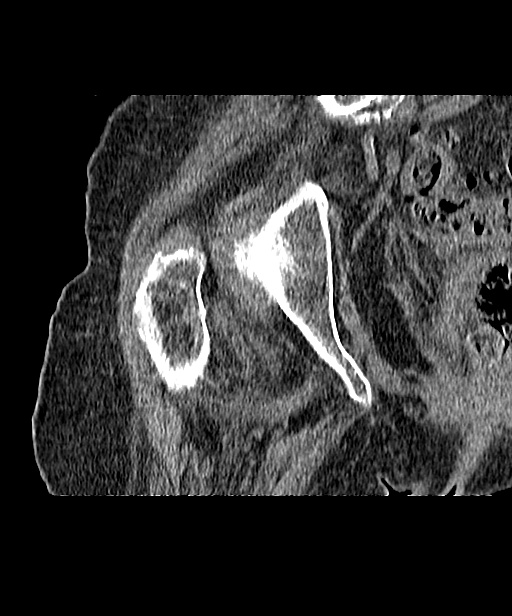
[im 23/46  soft-tissue]
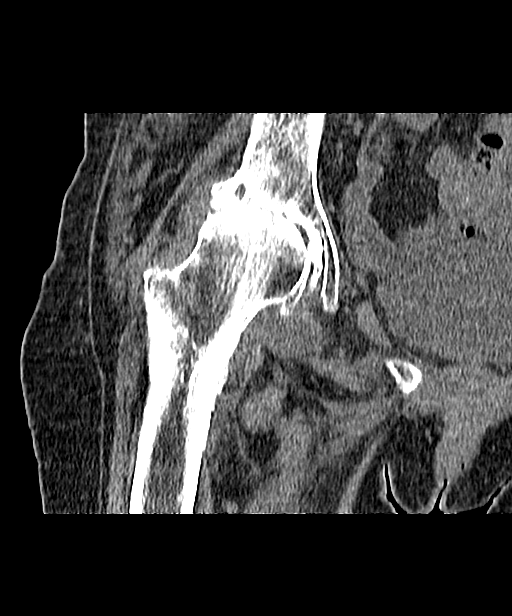
[im 34/46  soft-tissue]
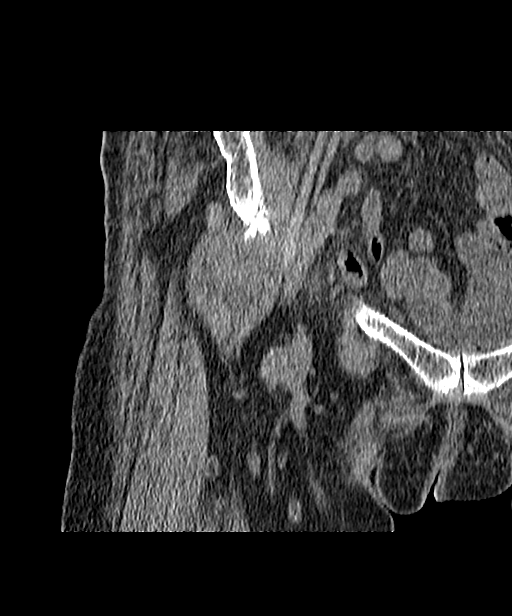

[17 of 46 positions shown; findings below may reference images not displayed]

FINDINGS: Diffuse osseous demineralization.

Advanced osteoarthritic changes of the right hip joint with joint
space obliteration, bone-on-bone appearance, spur formation and
subchondral cyst formation.

No definite acute fracture, dislocation or bone destruction.

Associated right hip joint effusion.

Mild scattered atherosclerotic calcification.

Remaining regional soft tissues unremarkable.
IMPRESSION: Osseous demineralization.

Advanced osteoarthritic changes of the right hip joint with
associated joint effusion.

If patient has fever, leukocytosis or clinical suspicion of an
infected right hip joint, recommend joint aspiration to exclude
septic arthritis.

## 2014-12-18 DIAGNOSIS — R569 Unspecified convulsions: Secondary | ICD-10-CM | POA: Diagnosis not present

## 2014-12-18 DIAGNOSIS — F0150 Vascular dementia without behavioral disturbance: Secondary | ICD-10-CM | POA: Diagnosis not present

## 2014-12-18 DIAGNOSIS — I10 Essential (primary) hypertension: Secondary | ICD-10-CM | POA: Diagnosis not present

## 2014-12-18 DIAGNOSIS — M1990 Unspecified osteoarthritis, unspecified site: Secondary | ICD-10-CM | POA: Diagnosis not present

## 2015-02-17 DIAGNOSIS — R569 Unspecified convulsions: Secondary | ICD-10-CM | POA: Diagnosis not present

## 2015-02-17 DIAGNOSIS — I10 Essential (primary) hypertension: Secondary | ICD-10-CM | POA: Diagnosis not present

## 2015-02-17 DIAGNOSIS — F0150 Vascular dementia without behavioral disturbance: Secondary | ICD-10-CM | POA: Diagnosis not present

## 2015-02-17 DIAGNOSIS — M159 Polyosteoarthritis, unspecified: Secondary | ICD-10-CM | POA: Diagnosis not present

## 2015-04-10 DIAGNOSIS — M79671 Pain in right foot: Secondary | ICD-10-CM | POA: Diagnosis not present

## 2015-04-23 DIAGNOSIS — M1990 Unspecified osteoarthritis, unspecified site: Secondary | ICD-10-CM | POA: Diagnosis not present

## 2015-04-23 DIAGNOSIS — F0150 Vascular dementia without behavioral disturbance: Secondary | ICD-10-CM | POA: Diagnosis not present

## 2015-04-23 DIAGNOSIS — R569 Unspecified convulsions: Secondary | ICD-10-CM | POA: Diagnosis not present

## 2015-04-23 DIAGNOSIS — I10 Essential (primary) hypertension: Secondary | ICD-10-CM | POA: Diagnosis not present

## 2015-06-24 DIAGNOSIS — M159 Polyosteoarthritis, unspecified: Secondary | ICD-10-CM | POA: Diagnosis not present

## 2015-06-24 DIAGNOSIS — R569 Unspecified convulsions: Secondary | ICD-10-CM | POA: Diagnosis not present

## 2015-06-24 DIAGNOSIS — F0150 Vascular dementia without behavioral disturbance: Secondary | ICD-10-CM | POA: Diagnosis not present

## 2015-08-20 DIAGNOSIS — I10 Essential (primary) hypertension: Secondary | ICD-10-CM | POA: Diagnosis not present

## 2015-08-20 DIAGNOSIS — M1990 Unspecified osteoarthritis, unspecified site: Secondary | ICD-10-CM | POA: Diagnosis not present

## 2015-08-20 DIAGNOSIS — R569 Unspecified convulsions: Secondary | ICD-10-CM | POA: Diagnosis not present

## 2015-08-20 DIAGNOSIS — F0150 Vascular dementia without behavioral disturbance: Secondary | ICD-10-CM | POA: Diagnosis not present

## 2015-10-23 DIAGNOSIS — R569 Unspecified convulsions: Secondary | ICD-10-CM | POA: Diagnosis not present

## 2015-10-23 DIAGNOSIS — M159 Polyosteoarthritis, unspecified: Secondary | ICD-10-CM | POA: Diagnosis not present

## 2015-10-23 DIAGNOSIS — F0150 Vascular dementia without behavioral disturbance: Secondary | ICD-10-CM | POA: Diagnosis not present

## 2015-12-19 DIAGNOSIS — M1990 Unspecified osteoarthritis, unspecified site: Secondary | ICD-10-CM | POA: Diagnosis not present

## 2015-12-19 DIAGNOSIS — F419 Anxiety disorder, unspecified: Secondary | ICD-10-CM | POA: Diagnosis not present

## 2015-12-19 DIAGNOSIS — F0150 Vascular dementia without behavioral disturbance: Secondary | ICD-10-CM | POA: Diagnosis not present

## 2015-12-19 DIAGNOSIS — I10 Essential (primary) hypertension: Secondary | ICD-10-CM | POA: Diagnosis not present

## 2016-02-23 DIAGNOSIS — M159 Polyosteoarthritis, unspecified: Secondary | ICD-10-CM | POA: Diagnosis not present

## 2016-02-23 DIAGNOSIS — R569 Unspecified convulsions: Secondary | ICD-10-CM | POA: Diagnosis not present

## 2016-02-23 DIAGNOSIS — F0150 Vascular dementia without behavioral disturbance: Secondary | ICD-10-CM | POA: Diagnosis not present

## 2016-02-23 DIAGNOSIS — R159 Full incontinence of feces: Secondary | ICD-10-CM | POA: Diagnosis not present

## 2016-02-23 DIAGNOSIS — F39 Unspecified mood [affective] disorder: Secondary | ICD-10-CM | POA: Diagnosis not present

## 2016-04-27 DIAGNOSIS — F39 Unspecified mood [affective] disorder: Secondary | ICD-10-CM | POA: Diagnosis not present

## 2016-04-27 DIAGNOSIS — M159 Polyosteoarthritis, unspecified: Secondary | ICD-10-CM | POA: Diagnosis not present

## 2016-04-27 DIAGNOSIS — G40909 Epilepsy, unspecified, not intractable, without status epilepticus: Secondary | ICD-10-CM | POA: Diagnosis not present

## 2016-04-27 DIAGNOSIS — F0150 Vascular dementia without behavioral disturbance: Secondary | ICD-10-CM | POA: Diagnosis not present

## 2016-06-16 DIAGNOSIS — F323 Major depressive disorder, single episode, severe with psychotic features: Secondary | ICD-10-CM

## 2016-06-16 DIAGNOSIS — G40909 Epilepsy, unspecified, not intractable, without status epilepticus: Secondary | ICD-10-CM

## 2016-06-16 DIAGNOSIS — M1990 Unspecified osteoarthritis, unspecified site: Secondary | ICD-10-CM | POA: Diagnosis not present

## 2016-06-16 DIAGNOSIS — F0150 Vascular dementia without behavioral disturbance: Secondary | ICD-10-CM | POA: Diagnosis not present

## 2016-08-27 DIAGNOSIS — M159 Polyosteoarthritis, unspecified: Secondary | ICD-10-CM | POA: Diagnosis not present

## 2016-08-27 DIAGNOSIS — F0150 Vascular dementia without behavioral disturbance: Secondary | ICD-10-CM | POA: Diagnosis not present

## 2016-08-27 DIAGNOSIS — G40909 Epilepsy, unspecified, not intractable, without status epilepticus: Secondary | ICD-10-CM | POA: Diagnosis not present

## 2016-08-27 DIAGNOSIS — F39 Unspecified mood [affective] disorder: Secondary | ICD-10-CM | POA: Diagnosis not present

## 2016-10-01 DIAGNOSIS — B351 Tinea unguium: Secondary | ICD-10-CM

## 2016-10-20 ENCOUNTER — Ambulatory Visit: Payer: Medicare Other | Admitting: Podiatry

## 2016-10-27 DIAGNOSIS — F0150 Vascular dementia without behavioral disturbance: Secondary | ICD-10-CM

## 2016-10-27 DIAGNOSIS — G40909 Epilepsy, unspecified, not intractable, without status epilepticus: Secondary | ICD-10-CM

## 2016-10-27 DIAGNOSIS — M1990 Unspecified osteoarthritis, unspecified site: Secondary | ICD-10-CM | POA: Diagnosis not present

## 2016-10-27 DIAGNOSIS — F39 Unspecified mood [affective] disorder: Secondary | ICD-10-CM | POA: Diagnosis not present

## 2016-12-23 DIAGNOSIS — F0150 Vascular dementia without behavioral disturbance: Secondary | ICD-10-CM | POA: Diagnosis not present

## 2016-12-23 DIAGNOSIS — M159 Polyosteoarthritis, unspecified: Secondary | ICD-10-CM | POA: Diagnosis not present

## 2016-12-23 DIAGNOSIS — F39 Unspecified mood [affective] disorder: Secondary | ICD-10-CM | POA: Diagnosis not present

## 2016-12-23 DIAGNOSIS — G40909 Epilepsy, unspecified, not intractable, without status epilepticus: Secondary | ICD-10-CM | POA: Diagnosis not present

## 2017-02-25 DIAGNOSIS — F39 Unspecified mood [affective] disorder: Secondary | ICD-10-CM | POA: Diagnosis not present

## 2017-02-25 DIAGNOSIS — F0150 Vascular dementia without behavioral disturbance: Secondary | ICD-10-CM | POA: Diagnosis not present

## 2017-02-25 DIAGNOSIS — M1990 Unspecified osteoarthritis, unspecified site: Secondary | ICD-10-CM | POA: Diagnosis not present

## 2017-02-25 DIAGNOSIS — G40901 Epilepsy, unspecified, not intractable, with status epilepticus: Secondary | ICD-10-CM | POA: Diagnosis not present

## 2017-04-14 DIAGNOSIS — I872 Venous insufficiency (chronic) (peripheral): Secondary | ICD-10-CM | POA: Diagnosis not present

## 2017-04-14 DIAGNOSIS — L97909 Non-pressure chronic ulcer of unspecified part of unspecified lower leg with unspecified severity: Secondary | ICD-10-CM | POA: Diagnosis not present

## 2017-04-14 DIAGNOSIS — F39 Unspecified mood [affective] disorder: Secondary | ICD-10-CM | POA: Diagnosis not present

## 2017-04-14 DIAGNOSIS — F445 Conversion disorder with seizures or convulsions: Secondary | ICD-10-CM | POA: Diagnosis not present

## 2017-04-14 DIAGNOSIS — F0150 Vascular dementia without behavioral disturbance: Secondary | ICD-10-CM | POA: Diagnosis not present

## 2017-04-14 DIAGNOSIS — F1120 Opioid dependence, uncomplicated: Secondary | ICD-10-CM | POA: Diagnosis not present

## 2017-06-03 DIAGNOSIS — B351 Tinea unguium: Secondary | ICD-10-CM | POA: Diagnosis not present

## 2017-06-14 DIAGNOSIS — L03116 Cellulitis of left lower limb: Secondary | ICD-10-CM | POA: Diagnosis not present

## 2017-06-22 DIAGNOSIS — I872 Venous insufficiency (chronic) (peripheral): Secondary | ICD-10-CM

## 2017-06-22 DIAGNOSIS — G40909 Epilepsy, unspecified, not intractable, without status epilepticus: Secondary | ICD-10-CM

## 2017-06-22 DIAGNOSIS — F0150 Vascular dementia without behavioral disturbance: Secondary | ICD-10-CM | POA: Diagnosis not present

## 2017-06-22 DIAGNOSIS — F1120 Opioid dependence, uncomplicated: Secondary | ICD-10-CM | POA: Diagnosis not present

## 2017-06-22 DIAGNOSIS — F39 Unspecified mood [affective] disorder: Secondary | ICD-10-CM | POA: Diagnosis not present

## 2017-08-18 DIAGNOSIS — F0150 Vascular dementia without behavioral disturbance: Secondary | ICD-10-CM | POA: Diagnosis not present

## 2017-08-18 DIAGNOSIS — F1120 Opioid dependence, uncomplicated: Secondary | ICD-10-CM | POA: Diagnosis not present

## 2017-08-18 DIAGNOSIS — L97909 Non-pressure chronic ulcer of unspecified part of unspecified lower leg with unspecified severity: Secondary | ICD-10-CM | POA: Diagnosis not present

## 2017-08-18 DIAGNOSIS — F39 Unspecified mood [affective] disorder: Secondary | ICD-10-CM | POA: Diagnosis not present

## 2017-08-18 DIAGNOSIS — R569 Unspecified convulsions: Secondary | ICD-10-CM | POA: Diagnosis not present

## 2017-08-18 DIAGNOSIS — E441 Mild protein-calorie malnutrition: Secondary | ICD-10-CM | POA: Diagnosis not present

## 2017-10-28 DIAGNOSIS — F1120 Opioid dependence, uncomplicated: Secondary | ICD-10-CM

## 2017-10-28 DIAGNOSIS — I872 Venous insufficiency (chronic) (peripheral): Secondary | ICD-10-CM

## 2017-10-28 DIAGNOSIS — F39 Unspecified mood [affective] disorder: Secondary | ICD-10-CM

## 2017-10-28 DIAGNOSIS — G40901 Epilepsy, unspecified, not intractable, with status epilepticus: Secondary | ICD-10-CM

## 2017-10-28 DIAGNOSIS — E43 Unspecified severe protein-calorie malnutrition: Secondary | ICD-10-CM | POA: Diagnosis not present

## 2017-12-20 DIAGNOSIS — F0150 Vascular dementia without behavioral disturbance: Secondary | ICD-10-CM

## 2017-12-20 DIAGNOSIS — G4089 Other seizures: Secondary | ICD-10-CM | POA: Diagnosis not present

## 2017-12-20 DIAGNOSIS — F39 Unspecified mood [affective] disorder: Secondary | ICD-10-CM | POA: Diagnosis not present

## 2017-12-20 DIAGNOSIS — F1121 Opioid dependence, in remission: Secondary | ICD-10-CM

## 2017-12-20 DIAGNOSIS — E441 Mild protein-calorie malnutrition: Secondary | ICD-10-CM

## 2018-01-18 ENCOUNTER — Telehealth: Payer: Self-pay | Admitting: Internal Medicine

## 2018-01-18 NOTE — Telephone Encounter (Signed)
Copied from CRM [id number]. Topic: General - Other >> Jan 18, 2018  3:35 PM Stephannie Li, NT wrote: Reason for TIR:[id number] daughter Brigid Re called and said [facility] called her concerning therapy for her mom , and she would like to discuss this with Dr Alphonsus Sias please call  her [phone]

## 2018-01-18 NOTE — Telephone Encounter (Signed)
I spoke with Brigid Re (DPR signed) and Shona Simpson told pts daughter Dr Alphonsus Sias had written PT order for pts legs. Windell Moulding wants to discuss the benefit of PT and pt not getting agitated. Windell Moulding wants to speak with Dr Alphonsus Sias and can wait for his return to the office at [facility].

## 2018-01-19 NOTE — Telephone Encounter (Signed)
April Conway the nurse at [facility] did talk to her this morning. I also left a message and she will call [facility] to [facility] if she has further questions

## 2018-02-22 DIAGNOSIS — F1120 Opioid dependence, uncomplicated: Secondary | ICD-10-CM | POA: Diagnosis not present

## 2018-02-22 DIAGNOSIS — G40909 Epilepsy, unspecified, not intractable, without status epilepticus: Secondary | ICD-10-CM

## 2018-02-22 DIAGNOSIS — F39 Unspecified mood [affective] disorder: Secondary | ICD-10-CM

## 2018-02-22 DIAGNOSIS — I872 Venous insufficiency (chronic) (peripheral): Secondary | ICD-10-CM

## 2018-02-22 DIAGNOSIS — E43 Unspecified severe protein-calorie malnutrition: Secondary | ICD-10-CM

## 2018-02-22 DIAGNOSIS — F0150 Vascular dementia without behavioral disturbance: Secondary | ICD-10-CM

## 2018-05-01 DIAGNOSIS — F0150 Vascular dementia without behavioral disturbance: Secondary | ICD-10-CM

## 2018-05-01 DIAGNOSIS — G40509 Epileptic seizures related to external causes, not intractable, without status epilepticus: Secondary | ICD-10-CM

## 2018-05-01 DIAGNOSIS — E441 Mild protein-calorie malnutrition: Secondary | ICD-10-CM

## 2018-05-01 DIAGNOSIS — F39 Unspecified mood [affective] disorder: Secondary | ICD-10-CM

## 2018-05-01 DIAGNOSIS — M159 Polyosteoarthritis, unspecified: Secondary | ICD-10-CM | POA: Diagnosis not present

## 2018-05-19 DIAGNOSIS — B351 Tinea unguium: Secondary | ICD-10-CM

## 2018-07-09 DEATH — deceased
